# Patient Record
Sex: Female | Born: 1960 | Race: White | Hispanic: No | Marital: Married | Smoking: Never smoker
Health system: Southern US, Community
[De-identification: ages and names within clinical notes are randomized; demographics above are authoritative.]

## PROBLEM LIST (undated history)

## (undated) DIAGNOSIS — M719 Bursopathy, unspecified: Secondary | ICD-10-CM

## (undated) DIAGNOSIS — L900 Lichen sclerosus et atrophicus: Secondary | ICD-10-CM

## (undated) DIAGNOSIS — Z923 Personal history of irradiation: Secondary | ICD-10-CM

## (undated) DIAGNOSIS — T8859XA Other complications of anesthesia, initial encounter: Secondary | ICD-10-CM

## (undated) HISTORY — PX: OTHER SURGICAL HISTORY: SHX169

## (undated) HISTORY — PX: OOPHORECTOMY: SHX86

## (undated) HISTORY — DX: Lichen sclerosus et atrophicus: L90.0

## (undated) HISTORY — PX: BUNIONECTOMY: SHX129

## (undated) HISTORY — DX: Bursopathy, unspecified: M71.9

---

## 1998-10-25 ENCOUNTER — Other Ambulatory Visit: Admission: RE | Admit: 1998-10-25 | Discharge: 1998-10-25 | Payer: Self-pay | Admitting: Gynecology

## 2000-01-08 ENCOUNTER — Encounter: Payer: Self-pay | Admitting: Gynecology

## 2000-01-08 ENCOUNTER — Encounter: Admission: RE | Admit: 2000-01-08 | Discharge: 2000-01-08 | Payer: Self-pay | Admitting: Gynecology

## 2000-01-15 ENCOUNTER — Encounter: Admission: RE | Admit: 2000-01-15 | Discharge: 2000-01-15 | Payer: Self-pay | Admitting: Gynecology

## 2000-01-15 ENCOUNTER — Encounter: Payer: Self-pay | Admitting: Gynecology

## 2000-02-11 ENCOUNTER — Other Ambulatory Visit: Admission: RE | Admit: 2000-02-11 | Discharge: 2000-02-11 | Payer: Self-pay | Admitting: Gynecology

## 2000-02-18 ENCOUNTER — Other Ambulatory Visit: Admission: RE | Admit: 2000-02-18 | Discharge: 2000-02-18 | Payer: Self-pay | Admitting: Gynecology

## 2000-04-11 ENCOUNTER — Ambulatory Visit (HOSPITAL_COMMUNITY): Admission: RE | Admit: 2000-04-11 | Discharge: 2000-04-11 | Payer: Self-pay | Admitting: Obstetrics and Gynecology

## 2000-04-11 ENCOUNTER — Encounter: Payer: Self-pay | Admitting: Obstetrics and Gynecology

## 2000-08-25 HISTORY — PX: ABDOMINAL HYSTERECTOMY: SHX81

## 2001-02-16 ENCOUNTER — Other Ambulatory Visit: Admission: RE | Admit: 2001-02-16 | Discharge: 2001-02-16 | Payer: Self-pay | Admitting: Obstetrics and Gynecology

## 2001-02-16 ENCOUNTER — Encounter: Payer: Self-pay | Admitting: Gynecology

## 2001-02-16 ENCOUNTER — Encounter: Admission: RE | Admit: 2001-02-16 | Discharge: 2001-02-16 | Payer: Self-pay | Admitting: Gynecology

## 2001-05-18 ENCOUNTER — Encounter (INDEPENDENT_AMBULATORY_CARE_PROVIDER_SITE_OTHER): Payer: Self-pay | Admitting: Specialist

## 2001-05-18 ENCOUNTER — Inpatient Hospital Stay (HOSPITAL_COMMUNITY): Admission: RE | Admit: 2001-05-18 | Discharge: 2001-05-21 | Payer: Self-pay | Admitting: Gynecology

## 2002-03-10 ENCOUNTER — Encounter: Payer: Self-pay | Admitting: Gynecology

## 2002-03-10 ENCOUNTER — Encounter: Admission: RE | Admit: 2002-03-10 | Discharge: 2002-03-10 | Payer: Self-pay | Admitting: Gynecology

## 2002-03-10 ENCOUNTER — Other Ambulatory Visit: Admission: RE | Admit: 2002-03-10 | Discharge: 2002-03-10 | Payer: Self-pay | Admitting: Gynecology

## 2003-04-10 ENCOUNTER — Encounter: Payer: Self-pay | Admitting: Obstetrics and Gynecology

## 2003-04-10 ENCOUNTER — Encounter: Admission: RE | Admit: 2003-04-10 | Discharge: 2003-04-10 | Payer: Self-pay | Admitting: Obstetrics and Gynecology

## 2003-04-10 ENCOUNTER — Other Ambulatory Visit: Admission: RE | Admit: 2003-04-10 | Discharge: 2003-04-10 | Payer: Self-pay | Admitting: Gynecology

## 2004-04-17 ENCOUNTER — Other Ambulatory Visit: Admission: RE | Admit: 2004-04-17 | Discharge: 2004-04-17 | Payer: Self-pay | Admitting: Gynecology

## 2004-04-17 ENCOUNTER — Encounter: Admission: RE | Admit: 2004-04-17 | Discharge: 2004-04-17 | Payer: Self-pay | Admitting: Obstetrics and Gynecology

## 2005-04-22 ENCOUNTER — Encounter: Admission: RE | Admit: 2005-04-22 | Discharge: 2005-04-22 | Payer: Self-pay | Admitting: Gynecology

## 2005-04-22 ENCOUNTER — Other Ambulatory Visit: Admission: RE | Admit: 2005-04-22 | Discharge: 2005-04-22 | Payer: Self-pay | Admitting: Gynecology

## 2006-05-05 ENCOUNTER — Other Ambulatory Visit: Admission: RE | Admit: 2006-05-05 | Discharge: 2006-05-05 | Payer: Self-pay | Admitting: Gynecology

## 2006-05-05 ENCOUNTER — Ambulatory Visit (HOSPITAL_COMMUNITY): Admission: RE | Admit: 2006-05-05 | Discharge: 2006-05-05 | Payer: Self-pay | Admitting: Gynecology

## 2007-05-07 ENCOUNTER — Other Ambulatory Visit: Admission: RE | Admit: 2007-05-07 | Discharge: 2007-05-07 | Payer: Self-pay | Admitting: Gynecology

## 2007-05-10 ENCOUNTER — Encounter: Admission: RE | Admit: 2007-05-10 | Discharge: 2007-05-10 | Payer: Self-pay | Admitting: Gynecology

## 2007-10-26 ENCOUNTER — Encounter: Admission: RE | Admit: 2007-10-26 | Discharge: 2007-10-26 | Payer: Self-pay | Admitting: Gynecology

## 2008-05-26 ENCOUNTER — Ambulatory Visit: Payer: Self-pay | Admitting: Women's Health

## 2008-05-26 ENCOUNTER — Other Ambulatory Visit: Admission: RE | Admit: 2008-05-26 | Discharge: 2008-05-26 | Payer: Self-pay | Admitting: Gynecology

## 2008-05-26 ENCOUNTER — Encounter: Payer: Self-pay | Admitting: Women's Health

## 2008-05-26 ENCOUNTER — Encounter: Admission: RE | Admit: 2008-05-26 | Discharge: 2008-05-26 | Payer: Self-pay | Admitting: Gynecology

## 2008-06-02 ENCOUNTER — Encounter: Admission: RE | Admit: 2008-06-02 | Discharge: 2008-06-02 | Payer: Self-pay | Admitting: Gynecology

## 2008-06-20 ENCOUNTER — Ambulatory Visit: Payer: Self-pay | Admitting: Women's Health

## 2009-06-06 ENCOUNTER — Ambulatory Visit: Payer: Self-pay | Admitting: Women's Health

## 2009-06-06 ENCOUNTER — Other Ambulatory Visit: Admission: RE | Admit: 2009-06-06 | Discharge: 2009-06-06 | Payer: Self-pay | Admitting: Gynecology

## 2009-06-06 ENCOUNTER — Encounter: Admission: RE | Admit: 2009-06-06 | Discharge: 2009-06-06 | Payer: Self-pay | Admitting: Gynecology

## 2009-06-06 ENCOUNTER — Encounter: Payer: Self-pay | Admitting: Women's Health

## 2010-06-07 ENCOUNTER — Other Ambulatory Visit: Admission: RE | Admit: 2010-06-07 | Discharge: 2010-06-07 | Payer: Self-pay | Admitting: Gynecology

## 2010-06-07 ENCOUNTER — Ambulatory Visit: Payer: Self-pay | Admitting: Women's Health

## 2010-06-07 ENCOUNTER — Encounter: Admission: RE | Admit: 2010-06-07 | Discharge: 2010-06-07 | Payer: Self-pay | Admitting: Gynecology

## 2010-06-20 ENCOUNTER — Encounter: Admission: RE | Admit: 2010-06-20 | Discharge: 2010-06-20 | Payer: Self-pay | Admitting: Gynecology

## 2011-01-10 NOTE — H&P (Signed)
[facility]  Patient:    Connie Alvarez, Connie Alvarez Visit Number: [id number] MRN: [id number]          Service Type: Attending:  Douglass Rivers, M.D. Dictated by:   Douglass Rivers, M.D.                           History and Physical  CHIEF COMPLAINT:              Suspect right ovarian endometrioma, cervical endometriosis.  HISTORY OF PRESENT ILLNESS:   The patient is a 50 year old G2, P2 contracepting with a vasectomy who was noted on her annual exam this year complaining of increasing dysmenorrhea, which has been increasing over the past several years but it has been getting substantially worse.  She was noted also during her exam to have a reddened area on her cervix that failed to resolve and underwent a colposcopy.  She had an ultrasound done to evaluate her pelvis, which showed persistence of a smoothly marginated complex cyst on the right adnexa that measured 24 x 23 x 20 after two months of follow-up with homogenous fluid.  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  Based on that, the presumption is that she has endometriosis in the pelvis and she is presenting electively for definitive surgery.  OB/GYN HISTORY:               Unremarkable.  She had two vaginal deliveries. Her Pap smears have been normal.  Her menses are every 25-28 days.  Of note, she attempted to be a donor egg for her sister in August 2001 and underwent ovarian stimulation for one cycle.  PAST MEDICAL HISTORY:         Significant only for headaches.  PAST SURGICAL HISTORY:        Significant for foot surgery in 1981 and again in 1999.  MEDICATIONS:                  None.  ALLERGIES:                    No known drug allergies.  PHYSICAL EXAMINATION:  GENERAL:                      She is a well-appearing female in no acute distress.  HEENT:                        Unremarkable.  NECK:                          Thyroid is smooth, nontender, and mobile.  HEART:                        Regular rate.  LUNGS:                        Clear to auscultation.  BREASTS:                      Without mass, discharge, retractions in the supine and upright position.  ABDOMEN:                      Soft and nontender.  GYNECOLOGIC:  Normal external female genitalia.  The BUS is negative.  The vagina is thick and moist and the cervix has three individual blebs beneath the squamocolumnar junction.  Biopsy of which is consistent with endometriosis.  The cervix is otherwise nontender.  The uterus is minimally tender but is mobile.  The adnexa were nontender.  EXTREMITIES:                  Negative.  LABORATORY DATA:              Ultrasound showed the uterus to be anteverted at 9.3 x 4.6 x 4.8 and the right ovarian complex mass of 2.4 x 2.3 x 2.  The left ovary is unremarkable.  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 It is suspected ovarian endometriosis on the right adnexa with increasing dysmenorrhea felt to be probably secondary to pelvic endometriosis.  The patient will present electively for definitive surgery.  She will have a TAH/RSO with fulguration of any endometriosis seen in the pelvis.  We had discussed with the patient and her husband the possibility of doing an LSO, in addition, if there is extensive disease or trying to retain the ovary and suppressing her postoperatively with Lupron.  They will discuss it and let us know prior to surgery.  Of course, if there is any extensive disease on that ovary, then we have permission to remove it at the time of surgery.  All questions were addressed.  Risks of bleeding, infection, damage to underlying bowel, bladder, ureters were discussed.  She will be bowel prepped because of the risk of damage to her colon.  Again, she will present in the morning of September 24. Dictated by:   Douglass Rivers, M.D. Attending:  Douglass Rivers, M.D. DD:  05/17/01 TD:  05/17/01 Job: [id number]

## 2011-01-10 NOTE — Op Note (Signed)
[facility]  Patient:    Connie Alvarez, Connie Alvarez Visit Number: [id number] MRN: [id number]          Service Type: GYN Location: 9300 [id number] Attending Physician:  Douglass Rivers Dictated by:   Douglass Rivers, M.D. Proc. Date: 05/18/01 Admit Date:  05/18/2001                             Operative Report  PREOPERATIVE DIAGNOSES:       1. Increasing dysmenorrhea.                               2. Cervical endometriosis.                               3. Suspect right endometrioma.  POSTOPERATIVE DIAGNOSES:      1. Increasing dysmenorrhea.                               2. Cervical endometriosis.                               3. Suspect right endometrioma.                               4. Extensive peritoneal endometriosis.  PROCEDURE:                    Total abdominal hysterectomy with bilateral salpingo-oophorectomy and fulguration of endometriosis.  SURGEON:                      Douglass Rivers, M.D.  ASSISTANT:                    Nadyne Coombes. Fontaine, M.D.  ANESTHESIA:                   Epidural.  IV FLUIDS:                    2 L lactated Ringers.  URINE OUTPUT:                 425 cc clear.  ESTIMATED BLOOD LOSS:         100 cc.  FINDINGS:                     There was a right ovarian endometrioma.  The left ovary had serosal disease.  It was markedly adherent to the side wall as well as colonic adhesions to the left adnexa.  There was scattered endometriosis throughout the bowel serosa, peritoneum and bladder.  The uterus was grossly normal.  PATHOLOGY:                    Uterus, cervix, tubes and ovaries.  COMPLICATIONS:                None.  DESCRIPTION OF PROCEDURE:     The patient was taken to the operating room. Epidural anesthesia was induced.  The patient was placed in the supine position, prepped and draped in the usual sterile fashion.  After adequate anesthesia was ensured, a Pfannenstiel skin incision  was made with a scalpel and  carried through to the underlying layer of fascia with electrocautery. The fascia was  scored in the midline.  The incision was extended laterally with Mayo scissors.  The superior aspect of the fascial incision was grasped with Kochers.  The underlying rectus muscles were dissected off with blunt and sharp dissection.  In a similar fashion, the superior aspect of the incision was grasped with Kochers.  The underlying rectus muscles were dissected off. The rectus muscles were separated in the midline.  The peritoneum was identified and entered bluntly.  The peritoneal incision was then extended superiorly and inferiorly with good visualization of the bowel and bladder. An OConner-OSullivan retractor was then placed in the incision with careful attention lateral to the psoas muscles.  The bowel was packed way with moist lap packs x 3 and upper and lower blades were placed.  There were noted to be extensive colonic adhesions to the left adnexa.  Scattered endometriosis was seen on the bladder flap anteriorly.  The adnexa were grasped with Heaney clamps.  The right round ligament was transected and suture ligated with 0 Vicryl.  The anterior leaf of the broad ligament was incised.  The posterior leaf was also incised.  The infundibulopelvic vessels were skeletonized.  The posterior leaf was entered sharply and transected.  A free tie was then placed and then a suture ligature, again with 0 Vicryl.  Attention was then turned towards the left.  The round ligament again was grasped and transected with electrocautery after the bowel was sharply dissected off.  In a similar fashion, the anterior leaf of the broad ligament was incised with careful attention to the endometriosis that was scattered around the peritoneum in order to include that in our dissection.  The tubo-ovarian ligament was then cross clamped, transected, free tied and a suture ligature of 0 Vicryl was placed.  The uterine vessels  on the left were skeletonized and the anterior leaf of the broad ligament.  Dissection was carried through to the midline bilaterally.  The bladder was noted to be markedly adherent to the uterus and cervix and required sharp dissection.  The uterine vessels were then successfully skeletonized with careful attention to the bladder anteriorly. They were transected and suture ligated x 2 with 0 Vicryl.  This was ultimately done bilaterally.  Again, attention was turned to the bladder and sharp dissection down was continued.  The cardinal ligaments were then transected and suture ligated bilaterally.  The uterosacral ligament was then taken in separate bites and was noted to be markedly thickened and scarred. Again, this was transected and suture ligated with 0 Vicryl.  The vagina was entered sharply during a lateral bite on the left and the cervix and uterus were sharply dissected off with a Jorgenson scissors.  The vaginal cuff was held with Kochers.  The angles were everted and transfixed to the ipsilateral cardinal and uterosacral ligaments.  Again, this was done bilaterally.  The vagina was then closed in a baseball-like fashion circumferentially, with careful attention to the bladder anteriorly with 0 Vicryl in a running locking fashion.  The pelvis was then irrigated with copious amounts of warm saline. Inspection of the bladder and peritoneum ensued.  Although the cul-de-sac was free, there was noted to be extensive scattered endometriosis on the serosa. When inspecting the pedicles on the left, we noted a large amount of endometriosis on the left ovary.  Although having a hemorrhagic cyst, it was otherwise unremarkable, but  was noted to be markedly adherent to the side wall.  It was bluntly dissected of and an extensive amount of endometriosis was seen underneath the ovary.  The bowel needed to be further sharply dissected off this adnexa.  The ureter was identified.  The  retroperitoneal space was extended.  Peristalsis was seen.  The infundibulopelvic vessels were also visualized, cross clamped and again free ties and suture ligatures of 0  Vicryl were placed.  Inspection of this area of the peritoneum showed that we had dissected most of it off with removal of the left tube and ovary.  The adnexa was noted to be hemostatic.  The endometriosis that was scattered on the colon was markedly adherent, and we felt that, now that we had removed both ovaries, that we would leave that in place and allow it to burn out with surgical menopause.  The pelvis was irrigated with copious amounts of warm saline.  Hemostasis was ensured at all pedicles.  Small areas of bleeding on the serosa and peritoneum were treated with electrocautery and noted to be hemostatic.  Sponges and retractors were then removed from the incision. Inspection of the peritoneum and muscles again assured [place] of hemostasis.  The fascia was closed with 0 Vicryl in a running fashion.  This was done bilaterally.  The subcu was irrigated.  The skin was closed with staples.  The patient tolerated the procedure well.  Sponge, lap, and needle counts were correct x 2.  She was transferred to the PACU in stable condition.  She also received cefotetan intraoperatively. Dictated by:   Douglass Rivers, M.D. Attending Physician:  Douglass Rivers DD:  05/18/01 TD:  05/18/01 Job: [id number]

## 2011-01-10 NOTE — Discharge Summary (Signed)
[facility]  Patient:    Connie Alvarez, Connie Alvarez Visit Number: [id number] MRN: [id number]          Service Type: GYN Location: 9300 [id number] [id number] Attending Physician:  Douglass Rivers Dictated by:   Antony Contras, [facility] Admit Date:  05/18/2001 Discharge Date: 05/21/2001                             Discharge Summary  DISCHARGE DIAGNOSES:          1. Increasing dysmenorrhea.                               2. Cervical endometriosis.                               3. Suspect right endometrioma.                               4. Extensive peritoneal endometriosis.  PROCEDURES:                   1. Total abdominal hysterectomy.                               2. Bilateral salpingo-oophorectomy.                               3. Fulguration of endometriosis.  HISTORY OF PRESENT ILLNESS:   The patient is a 50 year old, gravida 2, para 2, contraception with a vasectomy. She has been complaining of increasing dysmenorrhea over the past several years which has been substantially worse. She was noted during her exam to have a reddened area on her cervix that failed to resolve and underwent a colposcopy. Ultrasound showed a persistence of a smooth and marginated complex cyst on the right adnexa. 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 The patient is presenting for definitive surgery due to the presumption that she has endometriosis of the pelvis.  PAST MEDICAL HISTORY:         Past medical history includes a history of headaches. She has had two vaginal deliveries, normal Pap smears, and menses q.25-28 days.  ALLERGIES:                    No known drug allergies.  HOSPITAL COURSE/TREATMENT:    The patient was admitted on May 18, 2001. A total abdominal hysterectomy and bilateral salpingo-oophorectomy and fulguration of endometriosis was performed by Dr. Farrel Gobble assisted by Dr.  Audie Box under epidural anesthesia. Findings included a right ovarian endometrioma. The left ovary had serosal disease, was markedly adherent to the sidewall as well as colonic adhesions to the left adnexa. There was scattered endometriosis to the bowel serosa, peritoneum, and bladder. The uterus was grossly normal.  Postoperatively, the patient remained afebrile, had no difficulty voiding, and was able to be discharged on her third postoperative day in satisfactory condition.  Postoperative CBC:  Hematocrit 30.9, hemoglobin 10.8, WBCs 8.1, and platelets 198,000.  DISPOSITION:                  The patient was to  follow up in two weeks in the office with Dr. Farrel Gobble. Dictated by:   Antony Contras, [facility] Attending Physician:  Douglass Rivers DD:  06/11/01 TD:  06/15/01 Job: [id number]

## 2011-05-22 ENCOUNTER — Other Ambulatory Visit: Payer: Self-pay | Admitting: Gynecology

## 2011-05-22 DIAGNOSIS — Z1231 Encounter for screening mammogram for malignant neoplasm of breast: Secondary | ICD-10-CM

## 2011-06-24 DIAGNOSIS — N809 Endometriosis, unspecified: Secondary | ICD-10-CM | POA: Insufficient documentation

## 2011-06-24 DIAGNOSIS — L900 Lichen sclerosus et atrophicus: Secondary | ICD-10-CM | POA: Insufficient documentation

## 2011-06-24 DIAGNOSIS — K635 Polyp of colon: Secondary | ICD-10-CM | POA: Insufficient documentation

## 2011-06-27 ENCOUNTER — Ambulatory Visit
Admission: RE | Admit: 2011-06-27 | Discharge: 2011-06-27 | Disposition: A | Discharge status: Home / Self Care | Payer: BC Managed Care – PPO | Source: Ambulatory Visit | Attending: Gynecology | Admitting: Gynecology

## 2011-06-27 ENCOUNTER — Encounter: Payer: Self-pay | Admitting: Women's Health

## 2011-06-27 ENCOUNTER — Other Ambulatory Visit (HOSPITAL_COMMUNITY)
Admission: RE | Admit: 2011-06-27 | Discharge: 2011-06-27 | Disposition: A | Discharge status: Home / Self Care | Payer: BC Managed Care – PPO | Source: Ambulatory Visit | Attending: Women's Health | Admitting: Women's Health

## 2011-06-27 ENCOUNTER — Ambulatory Visit (INDEPENDENT_AMBULATORY_CARE_PROVIDER_SITE_OTHER): Payer: BC Managed Care – PPO | Admitting: Women's Health

## 2011-06-27 VITALS — BP 110/60 | Ht 65.0 in | Wt 152.0 lb

## 2011-06-27 DIAGNOSIS — Z01419 Encounter for gynecological examination (general) (routine) without abnormal findings: Secondary | ICD-10-CM | POA: Insufficient documentation

## 2011-06-27 DIAGNOSIS — Z1231 Encounter for screening mammogram for malignant neoplasm of breast: Secondary | ICD-10-CM

## 2011-06-27 DIAGNOSIS — Z833 Family history of diabetes mellitus: Secondary | ICD-10-CM

## 2011-06-27 DIAGNOSIS — Z780 Asymptomatic menopausal state: Secondary | ICD-10-CM

## 2011-06-27 MED ORDER — CLOBETASOL PROPIONATE 0.05 % EX CREA: TOPICAL_CREAM | Freq: Two times a day (BID) | CUTANEOUS | Status: AC

## 2011-06-27 MED ORDER — ESTRADIOL 1 MG PO TABS: 1.0000 mg | ORAL_TABLET | Freq: Every day | ORAL | Status: DC

## 2011-06-27 NOTE — Progress Notes (Signed)
Connie Alvarez 05-Nov-1960 [id number]    History:    The patient presents for annual exam.  Daughter [name], attorney working in [place] doing well. Other daughter living in [place] as a Risk analyst doing well. Works in Higher education careers adviser.   Past medical history, past surgical history, family history and social history were all reviewed and documented in the EPIC chart.   ROS:  A  ROS was performed and pertinent positives and negatives are included in the history.  Exam:  Filed Vitals:   06/27/11 0937  BP: 110/60    General appearance:  Normal Head/Neck:  Normal, without cervical or supraclavicular adenopathy. Thyroid:  Symmetrical, normal in size, without palpable masses or nodularity. Respiratory  Effort:  Normal  Auscultation:  Clear without wheezing or rhonchi Cardiovascular  Auscultation:  Regular rate, without rubs, murmurs or gallops  Edema/varicosities:  Not grossly evident Abdominal  Soft,nontender, without masses, guarding or rebound.  Liver/spleen:  No organomegaly noted  Hernia:  None appreciated  Skin  Inspection:  Grossly normal  Palpation:  Grossly normal Neurologic/psychiatric  Orientation:  Normal with appropriate conversation.  Mood/affect:  Normal  Genitourinary    Breasts: Examined lying and sitting.     Right: Without masses, retractions, discharge or axillary adenopathy.     Left: Without masses, retractions, discharge or axillary adenopathy.   Inguinal/mons:  Normal without inguinal adenopathy  External genitalia:  Normal  BUS/Urethra/Skene's glands:  Normal  Bladder:  Normal  Vagina:  Normal  Cervix:   absent  Uterus:    Adnexa/parametria:     Rt: Without masses or tenderness.   Lt: Without masses or tenderness.  Anus and perineum: Normal  Digital rectal exam: Normal sphincter tone without palpated masses or tenderness  Assessment/Plan:  50 y.o. MWF G2P2 for annual exam with no complaints. Colonoscopy in 2011  with 1 negative polyp, is aware to have repeat in 4 years. Normal DEXA in 2009 hip +0.4,  Has had normal mammograms.  TAH with BSO in 02 for endometriosis on ERT Lichen sclerosus/ clitoral hood  Plan: Estradiol 1 mg by mouth daily, slight risk for blood clots strokes and breast cancer was reviewed. States feels well would like to continue. SBEs, annual mammogram, continue exercise, healthy lifestyle, calcium rich diet, vitamin D 1000 daily. Temovate twice a day when necessary, is aware to use sparingly. Denies any problems at this time. CBC, glucose UA and Pap. Had a normal lipid profile last year. DEXA, will schedule.   Harrington Challenger [facility], 10:44 AM 06/27/2011

## 2012-07-14 ENCOUNTER — Other Ambulatory Visit: Payer: Self-pay | Admitting: Gynecology

## 2012-07-14 DIAGNOSIS — Z1231 Encounter for screening mammogram for malignant neoplasm of breast: Secondary | ICD-10-CM

## 2012-07-28 ENCOUNTER — Ambulatory Visit (INDEPENDENT_AMBULATORY_CARE_PROVIDER_SITE_OTHER): Payer: BC Managed Care – PPO | Admitting: Women's Health

## 2012-07-28 ENCOUNTER — Encounter: Payer: Self-pay | Admitting: Women's Health

## 2012-07-28 VITALS — BP 110/70 | Ht 65.5 in | Wt 147.0 lb

## 2012-07-28 DIAGNOSIS — K635 Polyp of colon: Secondary | ICD-10-CM

## 2012-07-28 DIAGNOSIS — Z833 Family history of diabetes mellitus: Secondary | ICD-10-CM

## 2012-07-28 DIAGNOSIS — Z01419 Encounter for gynecological examination (general) (routine) without abnormal findings: Secondary | ICD-10-CM

## 2012-07-28 DIAGNOSIS — Z780 Asymptomatic menopausal state: Secondary | ICD-10-CM

## 2012-07-28 DIAGNOSIS — D126 Benign neoplasm of colon, unspecified: Secondary | ICD-10-CM

## 2012-07-28 DIAGNOSIS — Z13220 Encounter for screening for lipoid disorders: Secondary | ICD-10-CM

## 2012-07-28 LAB — CBC WITH DIFFERENTIAL/PLATELET
Basophils Absolute: 0 10*3/uL (ref 0.0–0.1)
Eosinophils Absolute: 0.1 10*3/uL (ref 0.0–0.7)
Eosinophils Relative: 3 % (ref 0–5)
Lymphocytes Relative: 32 % (ref 12–46)
Lymphs Abs: 1.2 10*3/uL (ref 0.7–4.0)
MCH: 29 pg (ref 26.0–34.0)
MCV: 89.5 fL (ref 78.0–100.0)
Neutrophils Relative %: 53 % (ref 43–77)
Platelets: 252 10*3/uL (ref 150–400)
RBC: 4.27 MIL/uL (ref 3.87–5.11)
RDW: 13.4 % (ref 11.5–15.5)
WBC: 3.8 10*3/uL — ABNORMAL LOW (ref 4.0–10.5)

## 2012-07-28 LAB — LIPID PANEL: LDL Cholesterol: 144 mg/dL — ABNORMAL HIGH (ref 0–99)

## 2012-07-28 LAB — GLUCOSE, RANDOM: Glucose, Bld: 82 mg/dL (ref 70–99)

## 2012-07-28 MED ORDER — ESTRADIOL 1 MG PO TABS: 0.5000 mg | ORAL_TABLET | Freq: Every day | ORAL | Status: DC

## 2012-07-28 MED ORDER — CLOBETASOL PROPIONATE 0.05 % EX CREA: TOPICAL_CREAM | Freq: Two times a day (BID) | CUTANEOUS | Status: DC

## 2012-07-28 NOTE — Progress Notes (Signed)
Connie Alvarez 53-Sep-1962 [id number]    History:    The patient presents for annual exam.  TAH with BSO for endometriosis in 2002 on estradiol. Colonoscopy 2011 with 1 benign polyp. Bone density 05/2008 normal T score, bilateral hip average 0.3. History of normal Paps and mammograms. 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   Past medical history, past surgical history, family history and social history were all reviewed and documented in the EPIC chart. Currently not working outside the home. 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 Mother with  type 2 diabetes and hypertension, both parents living/late 62s.   ROS:  A  ROS was performed and pertinent positives and negatives are included in the history.  Exam:  Filed Vitals:   07/28/12 1053  BP: 110/70    General appearance:  Normal Head/Neck:  Normal, without cervical or supraclavicular adenopathy. Thyroid:  Symmetrical, normal in size, without palpable masses or nodularity. Respiratory  Effort:  Normal  Auscultation:  Clear without wheezing or rhonchi Cardiovascular  Auscultation:  Regular rate, without rubs, murmurs or gallops  Edema/varicosities:  Not grossly evident Abdominal  Soft,nontender, without masses, guarding or rebound.  Liver/spleen:  No organomegaly noted  Hernia:  None appreciated  Skin  Inspection:  Grossly normal  Palpation:  Grossly normal Neurologic/psychiatric  Orientation:  Normal with appropriate conversation.  Mood/affect:  Normal  Genitourinary    Breasts: Examined lying and sitting.     Right: Without masses, retractions, discharge or axillary adenopathy.     Left: Without masses, retractions, discharge or axillary adenopathy.   Inguinal/mons:  Normal without inguinal adenopathy  External genitalia:  Clitoral hood and posterior fourchette small patch of lichen sclerosis   BUS/Urethra/Skene's glands:  Normal  Bladder:  Normal  Vagina:   Normal  Cervix: Absent  Uterus: Absent  Adnexa/parametria:     Rt: Without masses or tenderness.   Lt: Without masses or tenderness.  Anus and perineum: Normal  Digital rectal exam: Normal sphincter tone without palpated masses or tenderness  Assessment/Plan:  51 y.o. M. WF G2 P2 for for annual exam with no complaints.  TAH/BSO endometriosis on ERT Lichen sclerosis Normal DEXA 05/2008 Benign colon polyp 2011  Plan: Currently on estradiol 1 mg with no hot flushes, will try estradiol 0.5. Prescription, proper use, slight risk for blood clots, strokes, breast cancer reviewed. Instructed to call if problems with change. SBE's, continue annual mammogram, scheduled in January. DEXA, will schedule next year, reviewed every five-years if normal. Reviewed importance to continue regular exercise, calcium rich diet, vitamin D 2000 daily. CBC, glucose, lipid panel, UA, home Hemoccult card given, Pap normal 2012, new screening guidelines reviewed. Temovate prescription, proper use, use twice daily as needed. Continue vaginal lubricants with intercourse.   Connie Alvarez [facility], 11:38 AM 07/28/2012

## 2012-07-28 NOTE — Progress Notes (Deleted)
Connie Alvarez 18-May-1961 [id number]    History:    The patient presents for annual exam.  ***   Past medical history, past surgical history, family history and social history were all reviewed and documented in the EPIC chart.   ROS:  A  ROS was performed and pertinent positives and negatives are included in the history.  Exam:  Filed Vitals:   07/28/12 1053  BP: 110/70    General appearance:  Normal Head/Neck:  Normal, without cervical or supraclavicular adenopathy. Thyroid:  Symmetrical, normal in size, without palpable masses or nodularity. Respiratory  Effort:  Normal  Auscultation:  Clear without wheezing or rhonchi Cardiovascular  Auscultation:  Regular rate, without rubs, murmurs or gallops  Edema/varicosities:  Not grossly evident Abdominal  Soft,nontender, without masses, guarding or rebound.  Liver/spleen:  No organomegaly noted  Hernia:  None appreciated  Skin  Inspection:  Grossly normal  Palpation:  Grossly normal Neurologic/psychiatric  Orientation:  Normal with appropriate conversation.  Mood/affect:  Normal  Genitourinary    Breasts: Examined lying and sitting.     Right: Without masses, retractions, discharge or axillary adenopathy.     Left: Without masses, retractions, discharge or axillary adenopathy.   Inguinal/mons:  Normal without inguinal adenopathy  External genitalia:  Normal  BUS/Urethra/Skene's glands:  Normal  Bladder:  Normal  Vagina:  Normal  Cervix:  Normal  Uterus:  ***verted, normal in size, shape and contour.  Midline and mobile  Adnexa/parametria:     Rt: Without masses or tenderness.   Lt: Without masses or tenderness.  Anus and perineum: Normal  Digital rectal exam: Normal sphincter tone without palpated masses or tenderness  Assessment/Plan:  51 y.o.  for annual exam.   ***.    Harrington Challenger [facility], 11:41 AM 07/28/2012

## 2012-07-28 NOTE — Patient Instructions (Signed)

## 2012-07-29 LAB — URINALYSIS W MICROSCOPIC + REFLEX CULTURE
Bilirubin Urine: NEGATIVE
Casts: NONE SEEN
Crystals: NONE SEEN
Glucose, UA: NEGATIVE mg/dL
Ketones, ur: NEGATIVE mg/dL
Nitrite: NEGATIVE
Specific Gravity, Urine: 1.016 (ref 1.005–1.030)
pH: 6 (ref 5.0–8.0)

## 2012-08-02 ENCOUNTER — Other Ambulatory Visit: Payer: Self-pay | Admitting: Women's Health

## 2012-08-02 DIAGNOSIS — E785 Hyperlipidemia, unspecified: Secondary | ICD-10-CM

## 2012-08-05 ENCOUNTER — Other Ambulatory Visit: Payer: Self-pay | Admitting: Women's Health

## 2012-08-05 DIAGNOSIS — N390 Urinary tract infection, site not specified: Secondary | ICD-10-CM

## 2012-08-06 ENCOUNTER — Encounter: Payer: Self-pay | Admitting: Gynecology

## 2012-08-10 ENCOUNTER — Other Ambulatory Visit: Payer: BC Managed Care – PPO

## 2012-08-10 DIAGNOSIS — N390 Urinary tract infection, site not specified: Secondary | ICD-10-CM

## 2012-08-11 LAB — URINALYSIS W MICROSCOPIC + REFLEX CULTURE
Bacteria, UA: NONE SEEN
Hgb urine dipstick: NEGATIVE
Ketones, ur: NEGATIVE mg/dL
Nitrite: NEGATIVE
Protein, ur: NEGATIVE mg/dL
Urobilinogen, UA: 0.2 mg/dL (ref 0.0–1.0)

## 2012-08-13 LAB — URINE CULTURE

## 2012-09-02 ENCOUNTER — Ambulatory Visit
Admission: RE | Admit: 2012-09-02 | Discharge: 2012-09-02 | Disposition: A | Discharge status: Home / Self Care | Payer: BC Managed Care – PPO | Source: Ambulatory Visit | Attending: Gynecology | Admitting: Gynecology

## 2012-09-02 DIAGNOSIS — Z1231 Encounter for screening mammogram for malignant neoplasm of breast: Secondary | ICD-10-CM

## 2012-10-09 ENCOUNTER — Other Ambulatory Visit: Payer: Self-pay

## 2013-06-30 ENCOUNTER — Other Ambulatory Visit: Payer: Self-pay

## 2013-09-29 ENCOUNTER — Other Ambulatory Visit: Payer: Self-pay

## 2013-09-29 DIAGNOSIS — Z1231 Encounter for screening mammogram for malignant neoplasm of breast: Secondary | ICD-10-CM

## 2013-10-13 ENCOUNTER — Ambulatory Visit: Payer: BC Managed Care – PPO

## 2013-10-25 ENCOUNTER — Ambulatory Visit (INDEPENDENT_AMBULATORY_CARE_PROVIDER_SITE_OTHER): Payer: No Typology Code available for payment source | Admitting: Women's Health

## 2013-10-25 ENCOUNTER — Ambulatory Visit
Admission: RE | Admit: 2013-10-25 | Discharge: 2013-10-25 | Disposition: A | Discharge status: Home / Self Care | Payer: No Typology Code available for payment source | Source: Ambulatory Visit

## 2013-10-25 ENCOUNTER — Encounter: Payer: Self-pay | Admitting: Women's Health

## 2013-10-25 VITALS — BP 112/62 | Ht 65.5 in | Wt 155.2 lb

## 2013-10-25 DIAGNOSIS — Z780 Asymptomatic menopausal state: Secondary | ICD-10-CM

## 2013-10-25 DIAGNOSIS — Z01419 Encounter for gynecological examination (general) (routine) without abnormal findings: Secondary | ICD-10-CM

## 2013-10-25 DIAGNOSIS — Z1231 Encounter for screening mammogram for malignant neoplasm of breast: Secondary | ICD-10-CM

## 2013-10-25 DIAGNOSIS — Z833 Family history of diabetes mellitus: Secondary | ICD-10-CM

## 2013-10-25 LAB — CBC WITH DIFFERENTIAL/PLATELET
BASOS PCT: 1 % (ref 0–1)
Basophils Absolute: 0 10*3/uL (ref 0.0–0.1)
EOS PCT: 4 % (ref 0–5)
Eosinophils Absolute: 0.2 10*3/uL (ref 0.0–0.7)
HEMATOCRIT: 38.2 % (ref 36.0–46.0)
HEMOGLOBIN: 12.6 g/dL (ref 12.0–15.0)
Lymphocytes Relative: 33 % (ref 12–46)
Lymphs Abs: 1.5 10*3/uL (ref 0.7–4.0)
MCH: 27.8 pg (ref 26.0–34.0)
MCHC: 33 g/dL (ref 30.0–36.0)
MCV: 84.1 fL (ref 78.0–100.0)
MONO ABS: 0.4 10*3/uL (ref 0.1–1.0)
MONOS PCT: 10 % (ref 3–12)
NEUTROS ABS: 2.3 10*3/uL (ref 1.7–7.7)
Neutrophils Relative %: 52 % (ref 43–77)
Platelets: 284 10*3/uL (ref 150–400)
RBC: 4.54 MIL/uL (ref 3.87–5.11)
RDW: 14 % (ref 11.5–15.5)
WBC: 4.4 10*3/uL (ref 4.0–10.5)

## 2013-10-25 LAB — GLUCOSE, RANDOM: GLUCOSE: 94 mg/dL (ref 70–99)

## 2013-10-25 MED ORDER — CLOBETASOL PROPIONATE 0.05 % EX CREA: TOPICAL_CREAM | Freq: Two times a day (BID) | CUTANEOUS | Status: DC

## 2013-10-25 MED ORDER — ESTRADIOL 0.5 MG PO TABS: 0.5000 mg | ORAL_TABLET | Freq: Every day | ORAL | Status: DC

## 2013-10-25 NOTE — Progress Notes (Signed)
Connie Alvarez June 04, 1961 [id number]    History:    Presents for annual exam.  TAH with BSO 2002 for endometriosis on estradiol 0.5 but stopped September 2014 and has had numerous hot flushes since. 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 Benign colon polyp 2011. Normal bone density 2009. Normal Pap and mammogram history.  Past medical history, past surgical history, family history and social history were all reviewed and documented in the EPIC chart. Laid off last year. Contemplating going back to work. Mickel Baas lives in [place] job, Caryl Pina an Forensic psychologist in [place].  ROS:  A  ROS was performed and pertinent positives and negatives are included.  Exam:  Filed Vitals:   10/25/13 0853  BP: 112/62    General appearance:  Normal Thyroid:  Symmetrical, normal in size, without palpable masses or nodularity. Respiratory  Auscultation:  Clear without wheezing or rhonchi Cardiovascular  Auscultation:  Regular rate, without rubs, murmurs or gallops  Edema/varicosities:  Not grossly evident Abdominal  Soft,nontender, without masses, guarding or rebound.  Liver/spleen:  No organomegaly noted  Hernia:  None appreciated  Skin  Inspection:  Grossly normal   Breasts: Examined lying and sitting.     Right: Without masses, retractions, discharge or axillary adenopathy.     Left: Without masses, retractions, discharge or axillary adenopathy. Gentitourinary   Inguinal/mons:  Normal without inguinal adenopathy  External genitalia: Lichen sclerosis at posterior introitus and clitoral hood  BUS/Urethra/Skene's glands:  Normal  Vagina:  Normal  Cervix:  Absent  Uterus: Absent Adnexa/parametria:     Rt: Without masses or tenderness.   Lt: Without masses or tenderness.  Anus and perineum: Normal  Digital rectal exam: Normal sphincter tone without palpated masses or tenderness  Assessment/Plan:  53 y.o.MWF G2P2  for annual exam.    TAH with BSO/endometriosis-Menopausal symptoms off  HRT Benign colon polyp 7867 Lichen sclerosus  Plan: Temovate ointment twice daily as needed to clitoral hood and posterior fourchette. Instructed to call if no relief of symptoms. SBE's, continue annual mammogram and 3-D tomography history of dense breast. Continue regular exercise, calcium rich diet, vitamin D 2000 daily encouraged. Discussion on HRT, estradiol 0.5 mg daily and then will decreased to half tablet daily. Reviewed need to decrease more slowly, stop in the winter months. Instructed to call if no relief of hot flushes and poor sleep. Reviewed risks of blood clots, strokes, breast cancer. Repeat DEXA. Repeat colonoscopy next year. CBC, glucose, UA,     Huel Cote [facility], 9:36 AM 10/25/2013

## 2013-10-25 NOTE — Patient Instructions (Signed)
Health Recommendations for Postmenopausal Women Respected and ongoing research has looked at the most common causes of death, disability, and poor quality of life in postmenopausal women. The causes include heart disease, diseases of blood vessels, diabetes, depression, cancer, and bone loss (osteoporosis). Many things can be done to help lower the chances of developing these and other common problems: CARDIOVASCULAR DISEASE Heart Disease: A heart attack is a medical emergency. Know the signs and symptoms of a heart attack. Below are things women can do to reduce their risk for heart disease.   Do not smoke. If you smoke, quit.  Aim for a healthy weight. Being overweight causes many preventable deaths. Eat a healthy and balanced diet and drink an adequate amount of liquids.  Get moving. Make a commitment to be more physically active. Aim for 30 minutes of activity on most, if not all days of the week.  Eat for heart health. Choose a diet that is low in saturated fat and cholesterol and eliminate trans fat. Include whole grains, vegetables, and fruits. Read and understand the labels on food containers before buying.  Know your numbers. Ask your caregiver to check your blood pressure, cholesterol (total, HDL, LDL, triglycerides) and blood glucose. Work with your caregiver on improving your entire clinical picture.  High blood pressure. Limit or stop your table salt intake (try salt substitute and food seasonings). Avoid salty foods and drinks. Read labels on food containers before buying. Eating well and exercising can help control high blood pressure. STROKE  Stroke is a medical emergency. Stroke may be the result of a blood clot in a blood vessel in the brain or by a brain hemorrhage (bleeding). Know the signs and symptoms of a stroke. To lower the risk of developing a stroke:  Avoid fatty foods.  Quit smoking.  Control your diabetes, blood pressure, and irregular heart rate. THROMBOPHLEBITIS  (BLOOD CLOT) OF THE LEG  Becoming overweight and leading a stationary lifestyle may also contribute to developing blood clots. Controlling your diet and exercising will help lower the risk of developing blood clots. CANCER SCREENING  Breast Cancer: Take steps to reduce your risk of breast cancer.  You should practice "breast self-awareness." This means understanding the normal appearance and feel of your breasts and should include breast self-examination. Any changes detected, no matter how small, should be reported to your caregiver.  After age 40, you should have a clinical breast exam (CBE) every year.  Starting at age 40, you should consider having a mammogram (breast X-ray) every year.  If you have a family history of breast cancer, talk to your caregiver about genetic screening.  If you are at high risk for breast cancer, talk to your caregiver about having an MRI and a mammogram every year.  Intestinal or Stomach Cancer: Tests to consider are a rectal exam, fecal occult blood, sigmoidoscopy, and colonoscopy. Women who are high risk may need to be screened at an earlier age and more often.  Cervical Cancer:  Beginning at age 30, you should have a Pap test every 3 years as long as the past 3 Pap tests have been normal.  If you have had past treatment for cervical cancer or a condition that could lead to cancer, you need Pap tests and screening for cancer for at least 20 years after your treatment.  If you had a hysterectomy for a problem that was not cancer or a condition that could lead to cancer, then you no longer need Pap tests.    If you are between ages 65 and 70, and you have had normal Pap tests going back 10 years, you no longer need Pap tests.  If Pap tests have been discontinued, risk factors (such as a new sexual partner) need to be reassessed to determine if screening should be resumed.  Some medical problems can increase the chance of getting cervical cancer. In these  cases, your caregiver may recommend more frequent screening and Pap tests.  Uterine Cancer: If you have vaginal bleeding after reaching menopause, you should notify your caregiver.  Ovarian cancer: Other than yearly pelvic exams, there are no reliable tests available to screen for ovarian cancer at this time except for yearly pelvic exams.  Lung Cancer: Yearly chest X-rays can detect lung cancer and should be done on high risk women, such as cigarette smokers and women with chronic lung disease (emphysema).  Skin Cancer: A complete body skin exam should be done at your yearly examination. Avoid overexposure to the sun and ultraviolet light lamps. Use a strong sun block cream when in the sun. All of these things are important in lowering the risk of skin cancer. MENOPAUSE Menopause Symptoms: Hormone therapy products are effective for treating symptoms associated with menopause:  Moderate to severe hot flashes.  Night sweats.  Mood swings.  Headaches.  Tiredness.  Loss of sex drive.  Insomnia.  Other symptoms. Hormone replacement carries certain risks, especially in older women. Women who use or are thinking about using estrogen or estrogen with progestin treatments should discuss that with their caregiver. Your caregiver will help you understand the benefits and risks. The ideal dose of hormone replacement therapy is not known. The Food and Drug Administration (FDA) has concluded that hormone therapy should be used only at the lowest doses and for the shortest amount of time to reach treatment goals.  OSTEOPOROSIS Protecting Against Bone Loss and Preventing Fracture: If you use hormone therapy for prevention of bone loss (osteoporosis), the risks for bone loss must outweigh the risk of the therapy. Ask your caregiver about other medications known to be safe and effective for preventing bone loss and fractures. To guard against bone loss or fractures, the following is recommended:  If  you are less than age 50, take 1000 mg of calcium and at least 600 mg of Vitamin D per day.  If you are greater than age 50 but less than age 70, take 1200 mg of calcium and at least 600 mg of Vitamin D per day.  If you are greater than age 70, take 1200 mg of calcium and at least 800 mg of Vitamin D per day. Smoking and excessive alcohol intake increases the risk of osteoporosis. Eat foods rich in calcium and vitamin D and do weight bearing exercises several times a week as your caregiver suggests. DIABETES Diabetes Melitus: If you have Type I or Type 2 diabetes, you should keep your blood sugar under control with diet, exercise and recommended medication. Avoid too many sweets, starchy and fatty foods. Being overweight can make control more difficult. COGNITION AND MEMORY Cognition and Memory: Menopausal hormone therapy is not recommended for the prevention of cognitive disorders such as Alzheimer's disease or memory loss.  DEPRESSION  Depression may occur at any age, but is common in elderly women. The reasons may be because of physical, medical, social (loneliness), or financial problems and needs. If you are experiencing depression because of medical problems and control of symptoms, talk to your caregiver about this. Physical activity and   exercise may help with mood and sleep. Community and volunteer involvement may help your sense of value and worth. If you have depression and you feel that the problem is getting worse or becoming severe, talk to your caregiver about treatment options that are best for you. ACCIDENTS  Accidents are common and can be serious in the elderly woman. Prepare your house to prevent accidents. Eliminate throw rugs, place hand bars in the bath, shower and toilet areas. Avoid wearing high heeled shoes or walking on wet, snowy, and icy areas. Limit or stop driving if you have vision or hearing problems, or you feel you are unsteady with you movements and  reflexes. HEPATITIS C Hepatitis C is a type of viral infection affecting the liver. It is spread mainly through contact with blood from an infected person. It can be treated, but if left untreated, it can lead to severe liver damage over years. Many people who are infected do not know that the virus is in their blood. If you are a "baby-boomer", it is recommended that you have one screening test for Hepatitis C. IMMUNIZATIONS  Several immunizations are important to consider having during your senior years, including:   Tetanus, diptheria, and pertussis booster shot.  Influenza every year before the flu season begins.  Pneumonia vaccine.  Shingles vaccine.  Others as indicated based on your specific needs. Talk to your caregiver about these. Document Released: 10/03/2005 Document Revised: 07/28/2012 Document Reviewed: 05/29/2008 [employer] Patient Information 2014 [employer].  

## 2013-10-26 LAB — URINALYSIS W MICROSCOPIC + REFLEX CULTURE
Casts: NONE SEEN
GLUCOSE, UA: NEGATIVE mg/dL
Hgb urine dipstick: NEGATIVE
Leukocytes, UA: NEGATIVE
Nitrite: NEGATIVE
PROTEIN: NEGATIVE mg/dL
Specific Gravity, Urine: 1.024 (ref 1.005–1.030)
Urobilinogen, UA: 0.2 mg/dL (ref 0.0–1.0)
pH: 5.5 (ref 5.0–8.0)

## 2013-11-08 ENCOUNTER — Ambulatory Visit: Payer: No Typology Code available for payment source

## 2013-11-11 ENCOUNTER — Ambulatory Visit (INDEPENDENT_AMBULATORY_CARE_PROVIDER_SITE_OTHER): Payer: No Typology Code available for payment source

## 2013-11-11 ENCOUNTER — Ambulatory Visit (INDEPENDENT_AMBULATORY_CARE_PROVIDER_SITE_OTHER): Payer: No Typology Code available for payment source | Admitting: Podiatrist

## 2013-11-11 ENCOUNTER — Encounter: Payer: Self-pay | Admitting: Podiatrist

## 2013-11-11 VITALS — BP 125/68 | HR 72 | Resp 18

## 2013-11-11 DIAGNOSIS — M7750 Other enthesopathy of unspecified foot: Secondary | ICD-10-CM

## 2013-11-11 DIAGNOSIS — M779 Enthesopathy, unspecified: Secondary | ICD-10-CM

## 2013-11-11 DIAGNOSIS — R52 Pain, unspecified: Secondary | ICD-10-CM

## 2013-11-11 DIAGNOSIS — M778 Other enthesopathies, not elsewhere classified: Secondary | ICD-10-CM

## 2013-11-11 NOTE — Progress Notes (Signed)
   Subjective:    Patient ID: Connie Alvarez, female    DOB: 10-27-1960, 53 y.o.   MRN: [id number]  HPI my left foot has been bothering me for about a couple of months and burns and throbs and is sore and tender and it on the ball and on the side of the 5th on my left and it hurts with going barefoot or flat shoes    Review of Systems  Musculoskeletal:       Joint pain  Hematological: Bruises/bleeds easily.  All other systems reviewed and are negative.       Objective:   Physical Exam  GENERAL APPEARANCE: Alert, conversant. Appropriately groomed. No acute distress.  VASCULAR: Pedal pulses palpable at 2/4 DP and PT bilateral.  Capillary refill time is immediate to all digits,  Proximal to distal cooling it warm to warm.  Digital hair growth is present bilateral  NEUROLOGIC: sensation is intact epicritically and protectively to 5.07 monofilament at 5/5 sites bilateral.  Light touch is intact bilateral, vibratory sensation intact bilateral, achilles tendon reflex is intact bilateral.  MUSCULOSKELETAL: Localized pain is present at the fourth metatarsophalangeal joint of the left foot. Generalized discomfort along the lateral foot is also present from the compensation of the foot. Rectus foot type is noted no digital contractures are seen.  DERMATOLOGIC: skin color, texture, and turger are within normal limits.       Assessment & Plan:  Capsulitis left fourth metatarsophalangeal joint Plan: Injected the fourth metatarsophalangeal joint with dexamethasone and Marcaine mixture. Patient will call if this is not beneficial and we will consider orthotics with an offloading pad to take the pressure off of this particular area of discomfort. Otherwise she be seen back as needed.

## 2013-11-14 ENCOUNTER — Other Ambulatory Visit: Payer: Self-pay | Admitting: Gynecology

## 2013-11-14 DIAGNOSIS — Z13820 Encounter for screening for osteoporosis: Secondary | ICD-10-CM

## 2013-12-06 ENCOUNTER — Ambulatory Visit (INDEPENDENT_AMBULATORY_CARE_PROVIDER_SITE_OTHER): Payer: No Typology Code available for payment source

## 2013-12-06 DIAGNOSIS — Z13820 Encounter for screening for osteoporosis: Secondary | ICD-10-CM

## 2014-01-06 ENCOUNTER — Ambulatory Visit (INDEPENDENT_AMBULATORY_CARE_PROVIDER_SITE_OTHER): Payer: No Typology Code available for payment source

## 2014-01-06 ENCOUNTER — Ambulatory Visit (INDEPENDENT_AMBULATORY_CARE_PROVIDER_SITE_OTHER): Payer: No Typology Code available for payment source | Admitting: Podiatrist

## 2014-01-06 ENCOUNTER — Encounter: Payer: Self-pay | Admitting: Podiatrist

## 2014-01-06 VITALS — BP 110/61 | HR 83 | Resp 13 | Ht 65.5 in | Wt 152.0 lb

## 2014-01-06 DIAGNOSIS — M8430XA Stress fracture, unspecified site, initial encounter for fracture: Secondary | ICD-10-CM

## 2014-01-06 DIAGNOSIS — M84376A Stress fracture, unspecified foot, initial encounter for fracture: Secondary | ICD-10-CM

## 2014-01-06 NOTE — Patient Instructions (Signed)
Metatarsal Stress Fracture When too much stress is put on the foot, as in running and jumping sports, the center shaft of the bones of the forefoot is very susceptible to stress fractures (break in bone). This is because of repetitive stress on the bone. This injury is more common if osteoporosis is present   DIAGNOSIS  Usually the diagnosis is made by history. The foot progressively becomes sorer with activities. X-rays may be negative (show no break) within the first 2 to 3 weeks of the beginning of pain. A later X-ray may show signs of healing bone (callus formation). A bone scan or MRI will usually make the diagnosis earlier. TREATMENT AND HOME CARE INSTRUCTIONS  Treatment may or may not include a cast, removable fracture boot, or walking shoe. Casts are used for short periods of time to prevent muscle atrophy (muscle wasting).  Activities should be stopped until further advised by your caregiver.  Wear shoes with adequate shock absorbing abilities.  Alternative exercise may be undertaken while waiting for healing. These may include bicycling and swimming, or as your caregiver suggests. If you do not have a cast or splint:  You may walk on your injured foot as tolerated or advised.  Do not put any weight on your injured foot for as long as directed by your caregiver. Slowly increase the amount of time you walk on the foot as the pain allows or as advised.  Use crutches until you can bear weight without pain. A gradual increase in weight bearing may help.  Apply ice to the injury for 15-20 minutes each hour while awake for the first 2 days. Put the ice in a plastic bag and place a towel between the bag of ice and your skin.  Only take over-the-counter or prescription medicines for pain, discomfort, or fever as directed by your caregiver. SEEK IMMEDIATE MEDICAL CARE IF:   Pain is becoming worse rather than better, or if pain is uncontrolled with medications.  You have increased  swelling or redness in the foot. MAKE SURE YOU:   Understand these instructions.  Will watch your condition.  Will get help right away if you are not doing well or get worse. Document Released: 08/08/2000 Document Revised: 11/03/2011 Document Reviewed: 06/06/2008 [facility] Patient Information 2014 [place].

## 2014-01-06 NOTE — Progress Notes (Signed)
Subjective: Patient presents today for continued pain on the left foot. She states the pain is mostly in the forefoot region and laterally especially when she wears flat shoes. She states the injection given last visit was of no benefit. She continues to have pain in her foot.  Objective physical examination is unchanged. She has continued local pain on the lateral aspect of the left foot and some general discomfort at the metatarsal heads 2, 3 of the left foot. X-rays are taken again for comparison and it does appear that she has a cortical fracture on the lateral aspect the second metatarsal. This is compared to the previous films and noted to be more prominent today.  Assessment: Stress fracture left foot  Plan: I put her in an air fracture walker and she will wear this for 3-4 weeks. I will see her back at that time and we will reassess the pain in her foot. If any problems or concerns arise she is instructed to call she is also to decrease her activity over the next 3 weeks while she is in the boot.

## 2014-02-03 ENCOUNTER — Ambulatory Visit: Payer: No Typology Code available for payment source | Admitting: Podiatrist

## 2014-02-08 ENCOUNTER — Ambulatory Visit (INDEPENDENT_AMBULATORY_CARE_PROVIDER_SITE_OTHER): Payer: No Typology Code available for payment source | Admitting: Podiatrist

## 2014-02-08 ENCOUNTER — Ambulatory Visit (INDEPENDENT_AMBULATORY_CARE_PROVIDER_SITE_OTHER): Payer: No Typology Code available for payment source

## 2014-02-08 DIAGNOSIS — M8430XA Stress fracture, unspecified site, initial encounter for fracture: Secondary | ICD-10-CM

## 2014-02-08 DIAGNOSIS — M8430XS Stress fracture, unspecified site, sequela: Secondary | ICD-10-CM

## 2014-02-08 DIAGNOSIS — IMO0002 Reserved for concepts with insufficient information to code with codable children: Secondary | ICD-10-CM

## 2014-02-09 NOTE — Progress Notes (Signed)
Subjective: Patient presents today for follow up of pain on the left foot.  She states the pain is improving with the use of the darco shoe.  She has been wearing it every day and states the foot is finally feeling better.  Objective physical examination is unchanged. She has some minor discomfort at the metatarsal heads 2, 3 of the left foot.   X-rays are taken again for comparison and it does appear that she has a cortical fracture on the lateral aspect the second metatarsal.   Assessment: Stress fracture left foot   Plan: I recommended she continue to wear the post op shoe for the next week then wean into a good pair of supportive and firm based shoes- hiking or tennis type.  If the pain comes back, she is to go back in the [place].  If the pain does not completely resolve in 3-4 weeks she will call

## 2014-03-08 ENCOUNTER — Ambulatory Visit (INDEPENDENT_AMBULATORY_CARE_PROVIDER_SITE_OTHER): Payer: No Typology Code available for payment source | Admitting: Podiatrist

## 2014-03-08 ENCOUNTER — Encounter: Payer: Self-pay | Admitting: Podiatrist

## 2014-03-08 VITALS — BP 124/70 | HR 75 | Resp 18

## 2014-03-08 DIAGNOSIS — M84375G Stress fracture, left foot, subsequent encounter for fracture with delayed healing: Secondary | ICD-10-CM

## 2014-03-08 DIAGNOSIS — D3613 Benign neoplasm of peripheral nerves and autonomic nervous system of lower limb, including hip: Secondary | ICD-10-CM

## 2014-03-08 DIAGNOSIS — Z4789 Encounter for other orthopedic aftercare: Secondary | ICD-10-CM

## 2014-03-08 DIAGNOSIS — D2120 Benign neoplasm of connective and other soft tissue of unspecified lower limb, including hip: Secondary | ICD-10-CM

## 2014-03-08 NOTE — Patient Instructions (Signed)
Our office will order you an MRI at [facility].  They will call for the appointment date and time.

## 2014-03-08 NOTE — Progress Notes (Signed)
THE LEFT FOOT IS STILL HURTING AND NOT [facility] SWELLING  Patient presents for follow up of the left foot.  She had been wearing the boot and her tennis shoes and has noticed some improvement but overall the foot is still bothersome.    Objective physical examination is unchanged. She has some discomfort at the metatarsal heads 3/4of the left foot-- neuroma is questioned versus stress fracture.   Assessment: neuroma versus Stress fracture left foot   Plan: I recommended an MRI to determine the reason for the foot pain.  Will call with the results of the MRI scan.

## 2014-03-09 ENCOUNTER — Telehealth: Payer: Self-pay | Admitting: *Deleted

## 2014-03-09 NOTE — Telephone Encounter (Signed)
Her order says MRI with Contrast.  If that's the case, it needs to be with and without contrast.  I told her it is supposed to be with contrast.  She asked if she could change the order to with and without contrast.  I told her yes.

## 2014-03-13 ENCOUNTER — Telehealth: Payer: Self-pay | Admitting: *Deleted

## 2014-03-13 NOTE — Telephone Encounter (Signed)
We need an order faxed to [place].  She is scheduled for an MRI on tomorrow.  I faxed the order.

## 2014-03-14 ENCOUNTER — Telehealth: Payer: Self-pay | Admitting: *Deleted

## 2014-03-14 ENCOUNTER — Ambulatory Visit
Admission: RE | Admit: 2014-03-14 | Discharge: 2014-03-14 | Disposition: A | Discharge status: Home / Self Care | Payer: No Typology Code available for payment source | Source: Ambulatory Visit | Attending: Podiatrist | Admitting: Podiatrist

## 2014-03-14 DIAGNOSIS — D3613 Benign neoplasm of peripheral nerves and autonomic nervous system of lower limb, including hip: Secondary | ICD-10-CM

## 2014-03-14 DIAGNOSIS — M84375G Stress fracture, left foot, subsequent encounter for fracture with delayed healing: Secondary | ICD-10-CM

## 2014-03-14 MED ORDER — GADOBENATE DIMEGLUMINE 529 MG/ML IV SOLN
7.0000 mL | Freq: Once | INTRAVENOUS | Status: AC | PRN
  Administered 2014-03-14: 7 mL via INTRAVENOUS

## 2014-03-14 NOTE — Telephone Encounter (Signed)
I called and gave her the authorization number for the MRI which is good for today only.  The number is [id number].

## 2014-03-14 NOTE — Telephone Encounter (Signed)
Need authorization from [facility] for her MRI.  She's coming in tomorrow.  The procedure code is [id number].

## 2014-03-14 NOTE — Telephone Encounter (Signed)
I called and spoke to [place] who took general information.  She transferred me to [facility] for clinical information.  Anda Kraft said the MRI was approved for today only.  Authorization number is [id number].

## 2014-03-16 ENCOUNTER — Telehealth: Payer: Self-pay | Admitting: *Deleted

## 2014-03-16 NOTE — Telephone Encounter (Signed)
Referral sent for physical therapy per Dr. Valentina Lucks to [facility]. Diagnosis: Metatarsalgia left foot, Evaluate and Treat 3 times a week for 4 weeks  [facility] [address] [place], [place] [address] Ph: [phone]

## 2014-03-16 NOTE — Telephone Encounter (Signed)
Patient returned my call.  I informed her that Dr. Valentina Lucks got her MRI results back.  They didn't show a Neuroma nor a fracture.  Just show some mild swelling.  I told her Dr. Valentina Lucks recommends physical therapy if you'd like to try that.  She stated yes, I'll try anything to help my foot.  I told her she will refer her to [facility].  I gave her the address and the phone.  I advised her to call at her convenience to schedule.

## 2014-03-16 NOTE — Telephone Encounter (Signed)
Message copied by Lolita Rieger on Thu Mar 16, 2014 12:45 PM ------      Message from: Bronson Ing      Created: Tue Mar 14, 2014  2:54 PM      Regarding: mri result       Can you let the patient know there is no fracture and no neuroma present.  Just some mild swelling in between the bones of the foot (which was read to be a normal finding) -- would recommend physical therapy to calm the foot down.            She does not need to see me to go over the result-- PT is the best option-- [facility] who I would [facility] her to.            Thanks!            E      ----- Message -----         From: Rad Results In Interface         Sent: 03/14/2014  12:55 PM           To: Trudie Buckler, DPM                   ------

## 2014-03-16 NOTE — Telephone Encounter (Signed)
I left the patient a message to call me back.

## 2014-03-24 ENCOUNTER — Ambulatory Visit: Payer: No Typology Code available for payment source | Admitting: Podiatrist

## 2014-06-26 ENCOUNTER — Encounter: Payer: Self-pay | Admitting: Podiatrist

## 2014-11-04 ENCOUNTER — Other Ambulatory Visit: Payer: Self-pay | Admitting: Women's Health

## 2014-11-08 ENCOUNTER — Other Ambulatory Visit: Payer: Self-pay

## 2014-11-08 DIAGNOSIS — Z1231 Encounter for screening mammogram for malignant neoplasm of breast: Secondary | ICD-10-CM

## 2014-11-22 ENCOUNTER — Ambulatory Visit (INDEPENDENT_AMBULATORY_CARE_PROVIDER_SITE_OTHER): Payer: BLUE CROSS/BLUE SHIELD | Admitting: Women's Health

## 2014-11-22 ENCOUNTER — Ambulatory Visit
Admission: RE | Admit: 2014-11-22 | Discharge: 2014-11-22 | Disposition: A | Discharge status: Home / Self Care | Payer: BLUE CROSS/BLUE SHIELD | Source: Ambulatory Visit

## 2014-11-22 ENCOUNTER — Encounter: Payer: Self-pay | Admitting: Women's Health

## 2014-11-22 ENCOUNTER — Other Ambulatory Visit: Payer: Self-pay

## 2014-11-22 VITALS — BP 122/78 | Ht 65.0 in | Wt 161.0 lb

## 2014-11-22 DIAGNOSIS — Z79890 Hormone replacement therapy (postmenopausal): Secondary | ICD-10-CM | POA: Diagnosis not present

## 2014-11-22 DIAGNOSIS — Z1231 Encounter for screening mammogram for malignant neoplasm of breast: Secondary | ICD-10-CM

## 2014-11-22 DIAGNOSIS — Z01419 Encounter for gynecological examination (general) (routine) without abnormal findings: Secondary | ICD-10-CM

## 2014-11-22 MED ORDER — ESTRADIOL 0.5 MG PO TABS: 0.5000 mg | ORAL_TABLET | Freq: Every day | ORAL | Status: DC

## 2014-11-22 NOTE — Progress Notes (Signed)
Connie Alvarez [birth year] [id number]    History:    Presents for annual exam.  2002 TAH with BSO for endometriosis on estradiol 0.5 daily. History of lichen sclerosis with minimal problems. 2011 benign colon polyp has scheduled follow-up this year. Normal Pap and mammogram history.  Past medical history, past surgical history, family history and social history were all reviewed and documented in the EPIC chart. Works part time, oldest daughter lives in [place] and travels, other daughter attorney in [place] both doing well. Has 8 siblings, mother diabetes and hypertension.  ROS:  A ROS was performed and pertinent positives and negatives are included.  Exam:  Filed Vitals:   11/22/14 1048  BP: 122/78    General appearance:  Normal Thyroid:  Symmetrical, normal in size, without palpable masses or nodularity. Respiratory  Auscultation:  Clear without wheezing or rhonchi Cardiovascular  Auscultation:  Regular rate, without rubs, murmurs or gallops  Edema/varicosities:  Not grossly evident Abdominal  Soft,nontender, without masses, guarding or rebound.  Liver/spleen:  No organomegaly noted  Hernia:  None appreciated  Skin  Inspection:  Grossly normal   Breasts: Examined lying and sitting.     Right: Without masses, retractions, discharge or axillary adenopathy.     Left: Without masses, retractions, discharge or axillary adenopathy. Gentitourinary   Inguinal/mons:  Normal without inguinal adenopathy  External genitalia:  Normal small non-irritated area of lichen sclerosis at posterior fourchette  BUS/Urethra/Skene's glands:  Normal  Vagina:  Normal  Cervix:  And uterus absent  Adnexa/parametria:     Rt: Without masses or tenderness.   Lt: Without masses or tenderness.  Anus and perineum: Normal  Digital rectal exam: Normal sphincter tone without palpated masses or tenderness  Assessment/Plan:  54 y.o. MWF G2 P2  for annual exam with no complaints  2002 TAH with BSO for  endometriosis on estradiol Lichen sclerosis  Plan: Estradiol 0.5 mg by mouth daily prescription, proper use, slight risk for blood clots and strokes breast cancer. Reviewed best to use shortest amount of time, states continues to have some hot flushes. Temovate pre-when necessary for lichen sclerosis. SBE's, continue annual screening mammogram 3-D tomography reviewed and encouraged history of dense breasts has scheduled. Keep scheduled follow-up for colonoscopy 2011 benign colon polyp. Labs at primary care. UA. Schedule DEXA, home safety, fall prevention and importance of regular weightbearing exercise reviewed. Has had problems with foot pain has follow-up scheduled.     Huel Cote WHNP, 1:19 PM 11/22/2014

## 2014-11-22 NOTE — Patient Instructions (Signed)
Health Recommendations for Postmenopausal Women Respected and ongoing research has looked at the most common causes of death, disability, and poor quality of life in postmenopausal women. The causes include heart disease, diseases of blood vessels, diabetes, depression, cancer, and bone loss (osteoporosis). Many things can be done to help lower the chances of developing these and other common problems. CARDIOVASCULAR DISEASE Heart Disease: A heart attack is a medical emergency. Know the signs and symptoms of a heart attack. Below are things women can do to reduce their risk for heart disease.   Do not smoke. If you smoke, quit.  Aim for a healthy weight. Being overweight causes many preventable deaths. Eat a healthy and balanced diet and drink an adequate amount of liquids.  Get moving. Make a commitment to be more physically active. Aim for 30 minutes of activity on most, if not all days of the week.  Eat for heart health. Choose a diet that is low in saturated fat and cholesterol and eliminate trans fat. Include whole grains, vegetables, and fruits. Read and understand the labels on food containers before buying.  Know your numbers. Ask your caregiver to check your blood pressure, cholesterol (total, HDL, LDL, triglycerides) and blood glucose. Work with your caregiver on improving your entire clinical picture.  High blood pressure. Limit or stop your table salt intake (try salt substitute and food seasonings). Avoid salty foods and drinks. Read labels on food containers before buying. Eating well and exercising can help control high blood pressure. STROKE  Stroke is a medical emergency. Stroke may be the result of a blood clot in a blood vessel in the brain or by a brain hemorrhage (bleeding). Know the signs and symptoms of a stroke. To lower the risk of developing a stroke:  Avoid fatty foods.  Quit smoking.  Control your diabetes, blood pressure, and irregular heart rate. THROMBOPHLEBITIS  (BLOOD CLOT) OF THE LEG  Becoming overweight and leading a stationary lifestyle may also contribute to developing blood clots. Controlling your diet and exercising will help lower the risk of developing blood clots. CANCER SCREENING  Breast Cancer: Take steps to reduce your risk of breast cancer.  You should practice "breast self-awareness." This means understanding the normal appearance and feel of your breasts and should include breast self-examination. Any changes detected, no matter how small, should be reported to your caregiver.  After age 40, you should have a clinical breast exam (CBE) every year.  Starting at age 40, you should consider having a mammogram (breast X-ray) every year.  If you have a family history of breast cancer, talk to your caregiver about genetic screening.  If you are at high risk for breast cancer, talk to your caregiver about having an MRI and a mammogram every year.  Intestinal or Stomach Cancer: Tests to consider are a rectal exam, fecal occult blood, sigmoidoscopy, and colonoscopy. Women who are high risk may need to be screened at an earlier age and more often.  Cervical Cancer:  Beginning at age 30, you should have a Pap test every 3 years as long as the past 3 Pap tests have been normal.  If you have had past treatment for cervical cancer or a condition that could lead to cancer, you need Pap tests and screening for cancer for at least 20 years after your treatment.  If you had a hysterectomy for a problem that was not cancer or a condition that could lead to cancer, then you no longer need Pap tests.    If you are between ages 65 and 70, and you have had normal Pap tests going back 10 years, you no longer need Pap tests.  If Pap tests have been discontinued, risk factors (such as a new sexual partner) need to be reassessed to determine if screening should be resumed.  Some medical problems can increase the chance of getting cervical cancer. In these  cases, your caregiver may recommend more frequent screening and Pap tests.  Uterine Cancer: If you have vaginal bleeding after reaching menopause, you should notify your caregiver.  Ovarian Cancer: Other than yearly pelvic exams, there are no reliable tests available to screen for ovarian cancer at this time except for yearly pelvic exams.  Lung Cancer: Yearly chest X-rays can detect lung cancer and should be done on high risk women, such as cigarette smokers and women with chronic lung disease (emphysema).  Skin Cancer: A complete body skin exam should be done at your yearly examination. Avoid overexposure to the sun and ultraviolet light lamps. Use a strong sun block cream when in the sun. All of these things are important for lowering the risk of skin cancer. MENOPAUSE Menopause Symptoms: Hormone therapy products are effective for treating symptoms associated with menopause:  Moderate to severe hot flashes.  Night sweats.  Mood swings.  Headaches.  Tiredness.  Loss of sex drive.  Insomnia.  Other symptoms. Hormone replacement carries certain risks, especially in older women. Women who use or are thinking about using estrogen or estrogen with progestin treatments should discuss that with their caregiver. Your caregiver will help you understand the benefits and risks. The ideal dose of hormone replacement therapy is not known. The Food and Drug Administration (FDA) has concluded that hormone therapy should be used only at the lowest doses and for the shortest amount of time to reach treatment goals.  OSTEOPOROSIS Protecting Against Bone Loss and Preventing Fracture If you use hormone therapy for prevention of bone loss (osteoporosis), the risks for bone loss must outweigh the risk of the therapy. Ask your caregiver about other medications known to be safe and effective for preventing bone loss and fractures. To guard against bone loss or fractures, the following is recommended:  If  you are younger than age 50, take 1000 mg of calcium and at least 600 mg of Vitamin D per day.  If you are older than age 50 but younger than age 70, take 1200 mg of calcium and at least 600 mg of Vitamin D per day.  If you are older than age 70, take 1200 mg of calcium and at least 800 mg of Vitamin D per day. Smoking and excessive alcohol intake increases the risk of osteoporosis. Eat foods rich in calcium and vitamin D and do weight bearing exercises several times a week as your caregiver suggests. DIABETES Diabetes Mellitus: If you have type I or type 2 diabetes, you should keep your blood sugar under control with diet, exercise, and recommended medication. Avoid starchy and fatty foods, and too many sweets. Being overweight can make diabetes control more difficult. COGNITION AND MEMORY Cognition and Memory: Menopausal hormone therapy is not recommended for the prevention of cognitive disorders such as Alzheimer's disease or memory loss.  DEPRESSION  Depression may occur at any age, but it is common in elderly women. This may be because of physical, medical, social (loneliness), or financial problems and needs. If you are experiencing depression because of medical problems and control of symptoms, talk to your caregiver about this. Physical   activity and exercise may help with mood and sleep. Community and volunteer involvement may improve your sense of value and worth. If you have depression and you feel that the problem is getting worse or becoming severe, talk to your caregiver about which treatment options are best for you. ACCIDENTS  Accidents are common and can be serious in elderly woman. Prepare your house to prevent accidents. Eliminate throw rugs, place hand bars in bath, shower, and toilet areas. Avoid wearing high heeled shoes or walking on wet, snowy, and icy areas. Limit or stop driving if you have vision or hearing problems, or if you feel you are unsteady with your movements and  reflexes. HEPATITIS C Hepatitis C is a type of viral infection affecting the liver. It is spread mainly through contact with blood from an infected person. It can be treated, but if left untreated, it can lead to severe liver damage over the years. Many people who are infected do not know that the virus is in their blood. If you are a "baby-boomer", it is recommended that you have one screening test for Hepatitis C. IMMUNIZATIONS  Several immunizations are important to consider having during your senior years, including:   Tetanus, diphtheria, and pertussis booster shot.  Influenza every year before the flu season begins.  Pneumonia vaccine.  Shingles vaccine.  Others, as indicated based on your specific needs. Talk to your caregiver about these. Document Released: 10/03/2005 Document Revised: 12/26/2013 Document Reviewed: 05/29/2008 [employer] Patient Information 2015 [employer]. This information is not intended to replace advice given to you by your health care provider. Make sure you discuss any questions you have with your health care provider.  

## 2014-11-23 LAB — URINALYSIS W MICROSCOPIC + REFLEX CULTURE
Bilirubin Urine: NEGATIVE
Casts: NONE SEEN
Crystals: NONE SEEN
Glucose, UA: NEGATIVE mg/dL
HGB URINE DIPSTICK: NEGATIVE
KETONES UR: NEGATIVE mg/dL
Leukocytes, UA: NEGATIVE
NITRITE: NEGATIVE
Protein, ur: NEGATIVE mg/dL
Specific Gravity, Urine: 1.015 (ref 1.005–1.030)
Squamous Epithelial / LPF: NONE SEEN
UROBILINOGEN UA: 0.2 mg/dL (ref 0.0–1.0)
pH: 6 (ref 5.0–8.0)

## 2014-11-24 ENCOUNTER — Encounter: Payer: Self-pay | Admitting: Women's Health

## 2014-11-25 LAB — URINE CULTURE

## 2014-11-28 ENCOUNTER — Other Ambulatory Visit: Payer: Self-pay | Admitting: Women's Health

## 2014-11-28 ENCOUNTER — Other Ambulatory Visit: Payer: Self-pay | Admitting: Anesthesiology

## 2014-11-28 ENCOUNTER — Other Ambulatory Visit: Payer: BLUE CROSS/BLUE SHIELD

## 2014-11-28 DIAGNOSIS — N3000 Acute cystitis without hematuria: Secondary | ICD-10-CM

## 2014-11-28 DIAGNOSIS — Z79890 Hormone replacement therapy (postmenopausal): Secondary | ICD-10-CM

## 2014-11-29 LAB — URINALYSIS W MICROSCOPIC + REFLEX CULTURE
Bilirubin Urine: NEGATIVE
Casts: NONE SEEN
Crystals: NONE SEEN
Glucose, UA: NEGATIVE mg/dL
HGB URINE DIPSTICK: NEGATIVE
KETONES UR: NEGATIVE mg/dL
Leukocytes, UA: NEGATIVE
Nitrite: NEGATIVE
Protein, ur: NEGATIVE mg/dL
Specific Gravity, Urine: 1.025 (ref 1.005–1.030)
Urobilinogen, UA: 0.2 mg/dL (ref 0.0–1.0)
pH: 5.5 (ref 5.0–8.0)

## 2014-12-19 ENCOUNTER — Other Ambulatory Visit: Payer: Self-pay | Admitting: Gynecology

## 2014-12-19 DIAGNOSIS — Z79890 Hormone replacement therapy (postmenopausal): Secondary | ICD-10-CM

## 2015-02-15 ENCOUNTER — Ambulatory Visit (INDEPENDENT_AMBULATORY_CARE_PROVIDER_SITE_OTHER): Payer: BLUE CROSS/BLUE SHIELD

## 2015-02-15 ENCOUNTER — Other Ambulatory Visit: Payer: Self-pay | Admitting: Gynecology

## 2015-02-15 DIAGNOSIS — Z13820 Encounter for screening for osteoporosis: Secondary | ICD-10-CM

## 2015-02-15 DIAGNOSIS — Z79890 Hormone replacement therapy (postmenopausal): Secondary | ICD-10-CM

## 2015-10-30 ENCOUNTER — Other Ambulatory Visit: Payer: Self-pay

## 2015-10-30 DIAGNOSIS — Z1231 Encounter for screening mammogram for malignant neoplasm of breast: Secondary | ICD-10-CM

## 2015-11-26 ENCOUNTER — Ambulatory Visit
Admission: RE | Admit: 2015-11-26 | Discharge: 2015-11-26 | Disposition: A | Discharge status: Home / Self Care | Payer: 59 | Source: Ambulatory Visit

## 2015-11-26 DIAGNOSIS — Z1231 Encounter for screening mammogram for malignant neoplasm of breast: Secondary | ICD-10-CM

## 2015-11-27 ENCOUNTER — Encounter: Payer: BLUE CROSS/BLUE SHIELD | Admitting: Women's Health

## 2015-12-05 ENCOUNTER — Encounter: Payer: Self-pay | Admitting: Women's Health

## 2015-12-05 ENCOUNTER — Ambulatory Visit (INDEPENDENT_AMBULATORY_CARE_PROVIDER_SITE_OTHER): Payer: 59 | Admitting: Women's Health

## 2015-12-05 VITALS — BP 118/78 | Ht 65.0 in | Wt 163.0 lb

## 2015-12-05 DIAGNOSIS — L900 Lichen sclerosus et atrophicus: Secondary | ICD-10-CM

## 2015-12-05 DIAGNOSIS — Z13220 Encounter for screening for lipoid disorders: Secondary | ICD-10-CM

## 2015-12-05 DIAGNOSIS — Z79890 Hormone replacement therapy (postmenopausal): Secondary | ICD-10-CM | POA: Diagnosis not present

## 2015-12-05 DIAGNOSIS — Z1329 Encounter for screening for other suspected endocrine disorder: Secondary | ICD-10-CM | POA: Diagnosis not present

## 2015-12-05 DIAGNOSIS — K635 Polyp of colon: Secondary | ICD-10-CM

## 2015-12-05 DIAGNOSIS — Z01419 Encounter for gynecological examination (general) (routine) without abnormal findings: Secondary | ICD-10-CM | POA: Diagnosis not present

## 2015-12-05 LAB — CBC WITH DIFFERENTIAL/PLATELET
Basophils Absolute: 37 cells/uL (ref 0–200)
Basophils Relative: 1 %
EOS PCT: 5 %
Eosinophils Absolute: 185 cells/uL (ref 15–500)
HCT: 36.6 % (ref 35.0–45.0)
HEMOGLOBIN: 11.8 g/dL (ref 11.7–15.5)
LYMPHS PCT: 28 %
Lymphs Abs: 1036 cells/uL (ref 850–3900)
MCH: 28.4 pg (ref 27.0–33.0)
MCHC: 32.2 g/dL (ref 32.0–36.0)
MCV: 88 fL (ref 80.0–100.0)
MONO ABS: 296 {cells}/uL (ref 200–950)
MPV: 9.1 fL (ref 7.5–12.5)
Monocytes Relative: 8 %
NEUTROS PCT: 58 %
Neutro Abs: 2146 cells/uL (ref 1500–7800)
Platelets: 272 10*3/uL (ref 140–400)
RBC: 4.16 MIL/uL (ref 3.80–5.10)
RDW: 14 % (ref 11.0–15.0)
WBC: 3.7 10*3/uL — ABNORMAL LOW (ref 3.8–10.8)

## 2015-12-05 LAB — COMPREHENSIVE METABOLIC PANEL
ALBUMIN: 3.9 g/dL (ref 3.6–5.1)
ALT: 12 U/L (ref 6–29)
AST: 16 U/L (ref 10–35)
Alkaline Phosphatase: 52 U/L (ref 33–130)
BUN: 23 mg/dL (ref 7–25)
CALCIUM: 9.3 mg/dL (ref 8.6–10.4)
CO2: 26 mmol/L (ref 20–31)
Chloride: 104 mmol/L (ref 98–110)
Creat: 0.75 mg/dL (ref 0.50–1.05)
Glucose, Bld: 89 mg/dL (ref 65–99)
Potassium: 3.9 mmol/L (ref 3.5–5.3)
Sodium: 139 mmol/L (ref 135–146)
Total Bilirubin: 0.4 mg/dL (ref 0.2–1.2)
Total Protein: 6.5 g/dL (ref 6.1–8.1)

## 2015-12-05 LAB — LIPID PANEL
CHOL/HDL RATIO: 3.7 ratio (ref <=5.0)
Cholesterol: 193 mg/dL (ref 125–200)
HDL: 52 mg/dL (ref >=46)
LDL CALC: 110 mg/dL (ref <130)
TRIGLYCERIDES: 155 mg/dL — AB (ref <150)
VLDL: 31 mg/dL — ABNORMAL HIGH (ref <30)

## 2015-12-05 LAB — TSH: TSH: 2.8 mIU/L

## 2015-12-05 MED ORDER — CLOBETASOL PROPIONATE 0.05 % EX CREA: 1.0000 "application " | TOPICAL_CREAM | Freq: Two times a day (BID) | CUTANEOUS | Status: DC

## 2015-12-05 MED ORDER — ESTRADIOL 0.5 MG PO TABS: 0.5000 mg | ORAL_TABLET | Freq: Every day | ORAL | Status: DC

## 2015-12-05 NOTE — Patient Instructions (Signed)
Lichen Sclerosus Lichen sclerosus is a skin problem. It can happen on any part of the body, but it commonly involves the anal or genital areas. It can cause itching and discomfort in these areas. Treatment can help to control symptoms. When the genital area is affected, getting treatment is important because the condition can cause scarring that may lead to other problems. CAUSES The cause of this condition is not known. It could be the result of an overactive immune system or a lack of certain hormones. Lichen sclerosus is not an infection or a fungus. It is not passed from one person to another (not contagious). RISK FACTORS This condition is more likely to develop in women, usually after menopause. SYMPTOMS Symptoms of this condition include:  Thin, wrinkled, white areas on the skin.  Thickened white areas on the skin.  Red and swollen patches (lesions) on the skin.  Tears or cracks in the skin.  Bruising.  Blood blisters.  Severe itching. You may also have pain, itching, or burning with urination. Constipation is also common in people with lichen sclerosus. DIAGNOSIS This condition may be diagnosed with a physical exam. In some cases, a tissue sample (biopsy sample) may be removed to be looked at under a microscope. TREATMENT This condition is usually treated with medicated creams or ointments (topical steroids) that are applied over the affected areas. HOME CARE INSTRUCTIONS  Take over-the-counter and prescription medicines only as told by your health care provider.  Use creams or ointments as told by your health care provider.  Do not scratch the affected areas of skin.  Women should keep the vaginal area as clean and dry as possible.  Keep all follow-up visits as told by your health care provider. This is important. SEEK MEDICAL CARE IF:  You have increasing redness, swelling, or pain in the affected area.  You have fluid, blood, or pus coming from the affected  area.  You have new lesions on your skin.  You have pain or burning with urination.  You have pain during sex.   This information is not intended to replace advice given to you by your health care provider. Make sure you discuss any questions you have with your health care provider.   Document Released: 01/01/2011 Document Revised: 05/02/2015 Document Reviewed: 11/06/2014 Elsevier Interactive Patient Education [employer] Menopause is a normal process in which your reproductive ability comes to an end. This process happens gradually over a span of months to years, usually between the ages of 76 and 91. Menopause is complete when you have missed 12 consecutive menstrual periods. It is important to talk with your health care provider about some of the most common conditions that affect postmenopausal women, such as heart disease, cancer, and bone loss (osteoporosis). Adopting a healthy lifestyle and getting preventive care can help to promote your health and wellness. Those actions can also lower your chances of developing some of these common conditions. WHAT SHOULD I KNOW ABOUT MENOPAUSE? During menopause, you may experience a number of symptoms, such as:  Moderate-to-severe hot flashes.  Night sweats.  Decrease in sex drive.  Mood swings.  Headaches.  Tiredness.  Irritability.  Memory problems.  Insomnia. Choosing to treat or not to treat menopausal changes is an individual decision that you make with your health care provider. WHAT SHOULD I KNOW ABOUT HORMONE REPLACEMENT THERAPY AND SUPPLEMENTS? Hormone therapy products are effective for treating symptoms that are associated with menopause, such as hot flashes and night sweats. Hormone replacement  carries certain risks, especially as you become older. If you are thinking about using estrogen or estrogen with progestin treatments, discuss the benefits and risks with your health care provider. WHAT SHOULD I KNOW ABOUT  HEART DISEASE AND STROKE? Heart disease, heart attack, and stroke become more likely as you age. This may be due, in part, to the hormonal changes that your body experiences during menopause. These can affect how your body processes dietary fats, triglycerides, and cholesterol. Heart attack and stroke are both medical emergencies. There are many things that you can do to help prevent heart disease and stroke:  Have your blood pressure checked at least every 1-2 years. High blood pressure causes heart disease and increases the risk of stroke.  If you are 22-56 years old, ask your health care provider if you should take aspirin to prevent a heart attack or a stroke.  Do not use any tobacco products, including cigarettes, chewing tobacco, or electronic cigarettes. If you need help quitting, ask your health care provider.  It is important to eat a healthy diet and maintain a healthy weight.  Be sure to include plenty of vegetables, fruits, low-fat dairy products, and lean protein.  Avoid eating foods that are high in solid fats, added sugars, or salt (sodium).  Get regular exercise. This is one of the most important things that you can do for your health.  Try to exercise for at least 150 minutes each week. The type of exercise that you do should increase your heart rate and make you sweat. This is known as moderate-intensity exercise.  Try to do strengthening exercises at least twice each week. Do these in addition to the moderate-intensity exercise.  Know your numbers.Ask your health care provider to check your cholesterol and your blood glucose. Continue to have your blood tested as directed by your health care provider. WHAT SHOULD I KNOW ABOUT CANCER SCREENING? There are several types of cancer. Take the following steps to reduce your risk and to catch any cancer development as early as possible. Breast Cancer  Practice breast self-awareness.  This means understanding how your breasts  normally appear and feel.  It also means doing regular breast self-exams. Let your health care provider know about any changes, no matter how small.  If you are 90 or older, have a clinician do a breast exam (clinical breast exam or CBE) every year. Depending on your age, family history, and medical history, it may be recommended that you also have a yearly breast X-ray (mammogram).  If you have a family history of breast cancer, talk with your health care provider about genetic screening.  If you are at high risk for breast cancer, talk with your health care provider about having an MRI and a mammogram every year.  Breast cancer (BRCA) gene test is recommended for women who have family members with BRCA-related cancers. Results of the assessment will determine the need for genetic counseling and BRCA1 and for BRCA2 testing. BRCA-related cancers include these types:  Breast. This occurs in males or females.  Ovarian.  Tubal. This may also be called fallopian tube cancer.  Cancer of the abdominal or pelvic lining (peritoneal cancer).  Prostate.  Pancreatic. Cervical, Uterine, and Ovarian Cancer Your health care provider may recommend that you be screened regularly for cancer of the pelvic organs. These include your ovaries, uterus, and vagina. This screening involves a pelvic exam, which includes checking for microscopic changes to the surface of your cervix (Pap test).  For  women ages 21-65, health care providers may recommend a pelvic exam and a Pap test every three years. For women ages 58-65, they may recommend the Pap test and pelvic exam, combined with testing for human papilloma virus (HPV), every five years. Some types of HPV increase your risk of cervical cancer. Testing for HPV may also be done on women of any age who have unclear Pap test results.  Other health care providers may not recommend any screening for nonpregnant women who are considered low risk for pelvic cancer and  have no symptoms. Ask your health care provider if a screening pelvic exam is right for you.  If you have had past treatment for cervical cancer or a condition that could lead to cancer, you need Pap tests and screening for cancer for at least 20 years after your treatment. If Pap tests have been discontinued for you, your risk factors (such as having a new sexual partner) need to be reassessed to determine if you should start having screenings again. Some women have medical problems that increase the chance of getting cervical cancer. In these cases, your health care provider may recommend that you have screening and Pap tests more often.  If you have a family history of uterine cancer or ovarian cancer, talk with your health care provider about genetic screening.  If you have vaginal bleeding after reaching menopause, tell your health care provider.  There are currently no reliable tests available to screen for ovarian cancer. Lung Cancer Lung cancer screening is recommended for adults 62-26 years old who are at high risk for lung cancer because of a history of smoking. A yearly low-dose CT scan of the lungs is recommended if you:  Currently smoke.  Have a history of at least 30 pack-years of smoking and you currently smoke or have quit within the past 15 years. A pack-year is smoking an average of one pack of cigarettes per day for one year. Yearly screening should:  Continue until it has been 15 years since you quit.  Stop if you develop a health problem that would prevent you from having lung cancer treatment. Colorectal Cancer  This type of cancer can be detected and can often be prevented.  Routine colorectal cancer screening usually begins at age 32 and continues through age 40.  If you have risk factors for colon cancer, your health care provider may recommend that you be screened at an earlier age.  If you have a family history of colorectal cancer, talk with your health care  provider about genetic screening.  Your health care provider may also recommend using home test kits to check for hidden blood in your stool.  A small camera at the end of a tube can be used to examine your colon directly (sigmoidoscopy or colonoscopy). This is done to check for the earliest forms of colorectal cancer.  Direct examination of the colon should be repeated every 5-10 years until age 30. However, if early forms of precancerous polyps or small growths are found or if you have a family history or genetic risk for colorectal cancer, you may need to be screened more often. Skin Cancer  Check your skin from head to toe regularly.  Monitor any moles. Be sure to tell your health care provider:  About any new moles or changes in moles, especially if there is a change in a mole's shape or color.  If you have a mole that is larger than the size of a pencil eraser.  If any of your family members has a history of skin cancer, especially at a Samvel Zinn age, talk with your health care provider about genetic screening.  Always use sunscreen. Apply sunscreen liberally and repeatedly throughout the day.  Whenever you are outside, protect yourself by wearing long sleeves, pants, a wide-brimmed hat, and sunglasses. WHAT SHOULD I KNOW ABOUT OSTEOPOROSIS? Osteoporosis is a condition in which bone destruction happens more quickly than new bone creation. After menopause, you may be at an increased risk for osteoporosis. To help prevent osteoporosis or the bone fractures that can happen because of osteoporosis, the following is recommended:  If you are 50-86 years old, get at least 1,000 mg of calcium and at least 600 mg of vitamin D per day.  If you are older than age 30 but younger than age 61, get at least 1,200 mg of calcium and at least 600 mg of vitamin D per day.  If you are older than age 40, get at least 1,200 mg of calcium and at least 800 mg of vitamin D per day. Smoking and excessive  alcohol intake increase the risk of osteoporosis. Eat foods that are rich in calcium and vitamin D, and do weight-bearing exercises several times each week as directed by your health care provider. WHAT SHOULD I KNOW ABOUT HOW MENOPAUSE AFFECTS Eugenio Saenz? Depression may occur at any age, but it is more common as you become older. Common symptoms of depression include:  Low or sad mood.  Changes in sleep patterns.  Changes in appetite or eating patterns.  Feeling an overall lack of motivation or enjoyment of activities that you previously enjoyed.  Frequent crying spells. Talk with your health care provider if you think that you are experiencing depression. WHAT SHOULD I KNOW ABOUT IMMUNIZATIONS? It is important that you get and maintain your immunizations. These include:  Tetanus, diphtheria, and pertussis (Tdap) booster vaccine.  Influenza every year before the flu season begins.  Pneumonia vaccine.  Shingles vaccine. Your health care provider may also recommend other immunizations.   This information is not intended to replace advice given to you by your health care provider. Make sure you discuss any questions you have with your health care provider.   Document Released: 10/03/2005 Document Revised: 09/01/2014 Document Reviewed: 04/13/2014 Elsevier Interactive Patient Education [employer].

## 2015-12-05 NOTE — Progress Notes (Signed)
Connie Alvarez 1961-03-06 [id number]    History:    Presents for annual exam.  Thousand 2 TAH with BSO from endometriosis on estradiol 0.5 daily. History of lichen sclerosis uses occasional Temovate. 2016 normal DEXA. 2011 benign colon polyp, 2016 negative colonoscopy. Normal Pap and mammogram history.  Past medical history, past surgical history, family history and social history were all reviewed and documented in the EPIC chart. Office work. 2 daughters 1 living in [place], one in [place] getting married this year. 8 siblings one with breast cancer. Mother diabetes and hypertension age 62. Originally from [place].  ROS:  A ROS was performed and pertinent positives and negatives are included.  Exam:  Filed Vitals:   12/05/15 0826  BP: 118/78    General appearance:  Normal Thyroid:  Symmetrical, normal in size, without palpable masses or nodularity. Respiratory  Auscultation:  Clear without wheezing or rhonchi Cardiovascular  Auscultation:  Regular rate, without rubs, murmurs or gallops  Edema/varicosities:  Not grossly evident Abdominal  Soft,nontender, without masses, guarding or rebound.  Liver/spleen:  No organomegaly noted  Hernia:  None appreciated  Skin  Inspection:  Grossly normal   Breasts: Examined lying and sitting.     Right: Without masses, retractions, discharge or axillary adenopathy.     Left: Without masses, retractions, discharge or axillary adenopathy. Gentitourinary   Inguinal/mons:  Normal without inguinal adenopathy  External genitalia:  Normal  BUS/Urethra/Skene's glands:  Normal  Vagina:  Normal  Cervix:  And uterus absent  Adnexa/parametria:     Rt: Without masses or tenderness.   Lt: Without masses or tenderness.  Anus and perineum: Normal  Digital rectal exam: Normal sphincter tone without palpated masses or tenderness  Assessment/Plan:  55 y.o. M WF G2 P2 for annual exam with no complaints.  2002 TAH with BSO for endometriosis on  estradiol Lichen sclerosis  Plan: HRT reviewed best to be on shortest amount of time, estradiol 0.5 prescription, proper use given and reviewed slight risk for blood clots, strokes and breast cancer. SBE's, continue annual 3-D screening mammogram history of dense breasts. Exercise, calcium rich diet, vitamin D 2000 daily encouraged. Temovate use sparingly, prescription given. CBC, CMP, lipid panel, vitamin D, TSH, UA.  Delorise Hunkele J WHNP, 1:05 PM 12/05/2015

## 2015-12-06 LAB — URINALYSIS W MICROSCOPIC + REFLEX CULTURE
Bacteria, UA: NONE SEEN [HPF]
Bilirubin Urine: NEGATIVE
CRYSTALS: NONE SEEN [HPF]
Casts: NONE SEEN [LPF]
Glucose, UA: NEGATIVE
Hgb urine dipstick: NEGATIVE
KETONES UR: NEGATIVE
Leukocytes, UA: NEGATIVE
NITRITE: NEGATIVE
PH: 5.5 (ref 5.0–8.0)
Protein, ur: NEGATIVE
RBC / HPF: NONE SEEN RBC/HPF (ref <=2)
SPECIFIC GRAVITY, URINE: 1.012 (ref 1.001–1.035)
Squamous Epithelial / LPF: NONE SEEN [HPF] (ref <=5)
WBC, UA: NONE SEEN WBC/HPF (ref <=5)
YEAST: NONE SEEN [HPF]

## 2015-12-06 LAB — VITAMIN D 25 HYDROXY (VIT D DEFICIENCY, FRACTURES): Vit D, 25-Hydroxy: 30 ng/mL (ref 30–100)

## 2016-06-12 ENCOUNTER — Other Ambulatory Visit: Payer: Self-pay

## 2016-06-12 DIAGNOSIS — Z79890 Hormone replacement therapy (postmenopausal): Secondary | ICD-10-CM

## 2016-06-12 MED ORDER — ESTRADIOL 0.5 MG PO TABS: 0.5000 mg | ORAL_TABLET | Freq: Every day | ORAL | 2 refills | Status: DC

## 2016-10-31 ENCOUNTER — Other Ambulatory Visit: Payer: Self-pay | Admitting: Women's Health

## 2016-10-31 DIAGNOSIS — Z1231 Encounter for screening mammogram for malignant neoplasm of breast: Secondary | ICD-10-CM

## 2016-12-08 ENCOUNTER — Encounter: Payer: 59 | Admitting: Women's Health

## 2016-12-08 ENCOUNTER — Ambulatory Visit: Payer: 59

## 2017-01-06 ENCOUNTER — Encounter: Payer: Self-pay | Admitting: Women's Health

## 2017-01-06 ENCOUNTER — Ambulatory Visit
Admission: RE | Admit: 2017-01-06 | Discharge: 2017-01-06 | Disposition: A | Discharge status: Home / Self Care | Payer: 59 | Source: Ambulatory Visit | Attending: Women's Health | Admitting: Women's Health

## 2017-01-06 ENCOUNTER — Ambulatory Visit (INDEPENDENT_AMBULATORY_CARE_PROVIDER_SITE_OTHER): Payer: 59 | Admitting: Women's Health

## 2017-01-06 VITALS — BP 120/82 | Ht 65.0 in | Wt 164.0 lb

## 2017-01-06 DIAGNOSIS — Z01419 Encounter for gynecological examination (general) (routine) without abnormal findings: Secondary | ICD-10-CM | POA: Diagnosis not present

## 2017-01-06 DIAGNOSIS — Z1231 Encounter for screening mammogram for malignant neoplasm of breast: Secondary | ICD-10-CM

## 2017-01-06 DIAGNOSIS — L900 Lichen sclerosus et atrophicus: Secondary | ICD-10-CM | POA: Diagnosis not present

## 2017-01-06 DIAGNOSIS — Z79890 Hormone replacement therapy (postmenopausal): Secondary | ICD-10-CM

## 2017-01-06 DIAGNOSIS — Z13220 Encounter for screening for lipoid disorders: Secondary | ICD-10-CM

## 2017-01-06 LAB — COMPREHENSIVE METABOLIC PANEL
ALK PHOS: 62 U/L (ref 33–130)
ALT: 7 U/L (ref 6–29)
AST: 12 U/L (ref 10–35)
Albumin: 4.1 g/dL (ref 3.6–5.1)
BUN: 14 mg/dL (ref 7–25)
CALCIUM: 9.2 mg/dL (ref 8.6–10.4)
CHLORIDE: 106 mmol/L (ref 98–110)
CO2: 24 mmol/L (ref 20–31)
Creat: 0.78 mg/dL (ref 0.50–1.05)
GLUCOSE: 90 mg/dL (ref 65–99)
Potassium: 3.8 mmol/L (ref 3.5–5.3)
Sodium: 140 mmol/L (ref 135–146)
Total Bilirubin: 0.3 mg/dL (ref 0.2–1.2)
Total Protein: 6.7 g/dL (ref 6.1–8.1)

## 2017-01-06 LAB — CBC WITH DIFFERENTIAL/PLATELET
BASOS ABS: 0 {cells}/uL (ref 0–200)
BASOS PCT: 0 %
Eosinophils Absolute: 162 cells/uL (ref 15–500)
Eosinophils Relative: 3 %
HCT: 39.3 % (ref 35.0–45.0)
Hemoglobin: 13.2 g/dL (ref 11.7–15.5)
LYMPHS PCT: 30 %
Lymphs Abs: 1620 cells/uL (ref 850–3900)
MCH: 29.3 pg (ref 27.0–33.0)
MCHC: 33.6 g/dL (ref 32.0–36.0)
MCV: 87.3 fL (ref 80.0–100.0)
MONO ABS: 432 {cells}/uL (ref 200–950)
MONOS PCT: 8 %
MPV: 8.7 fL (ref 7.5–12.5)
NEUTROS PCT: 59 %
Neutro Abs: 3186 cells/uL (ref 1500–7800)
PLATELETS: 236 10*3/uL (ref 140–400)
RBC: 4.5 MIL/uL (ref 3.80–5.10)
RDW: 13.6 % (ref 11.0–15.0)
WBC: 5.4 10*3/uL (ref 3.8–10.8)

## 2017-01-06 LAB — LIPID PANEL
Cholesterol: 194 mg/dL (ref <200)
HDL: 50 mg/dL — AB (ref >50)
LDL Cholesterol: 115 mg/dL — ABNORMAL HIGH (ref <100)
Total CHOL/HDL Ratio: 3.9 Ratio (ref <5.0)
Triglycerides: 144 mg/dL (ref <150)
VLDL: 29 mg/dL (ref <30)

## 2017-01-06 MED ORDER — CLOBETASOL PROPIONATE 0.05 % EX CREA: 1.0000 "application " | TOPICAL_CREAM | Freq: Two times a day (BID) | CUTANEOUS | 1 refills | Status: DC

## 2017-01-06 MED ORDER — ESTRADIOL 0.5 MG PO TABS: 0.5000 mg | ORAL_TABLET | Freq: Every day | ORAL | 4 refills | Status: DC

## 2017-01-06 NOTE — Patient Instructions (Signed)
Health Maintenance for Postmenopausal Women Menopause is a normal process in which your reproductive ability comes to an end. This process happens gradually over a span of months to years, usually between the ages of 33 and 38. Menopause is complete when you have missed 12 consecutive menstrual periods. It is important to talk with your health care provider about some of the most common conditions that affect postmenopausal women, such as heart disease, cancer, and bone loss (osteoporosis). Adopting a healthy lifestyle and getting preventive care can help to promote your health and wellness. Those actions can also lower your chances of developing some of these common conditions. What should I know about menopause? During menopause, you may experience a number of symptoms, such as:  Moderate-to-severe hot flashes.  Night sweats.  Decrease in sex drive.  Mood swings.  Headaches.  Tiredness.  Irritability.  Memory problems.  Insomnia. Choosing to treat or not to treat menopausal changes is an individual decision that you make with your health care provider. What should I know about hormone replacement therapy and supplements? Hormone therapy products are effective for treating symptoms that are associated with menopause, such as hot flashes and night sweats. Hormone replacement carries certain risks, especially as you become older. If you are thinking about using estrogen or estrogen with progestin treatments, discuss the benefits and risks with your health care provider. What should I know about heart disease and stroke? Heart disease, heart attack, and stroke become more likely as you age. This may be due, in part, to the hormonal changes that your body experiences during menopause. These can affect how your body processes dietary fats, triglycerides, and cholesterol. Heart attack and stroke are both medical emergencies. There are many things that you can do to help prevent heart disease  and stroke:  Have your blood pressure checked at least every 1-2 years. High blood pressure causes heart disease and increases the risk of stroke.  If you are 48-61 years old, ask your health care provider if you should take aspirin to prevent a heart attack or a stroke.  Do not use any tobacco products, including cigarettes, chewing tobacco, or electronic cigarettes. If you need help quitting, ask your health care provider.  It is important to eat a healthy diet and maintain a healthy weight.  Be sure to include plenty of vegetables, fruits, low-fat dairy products, and lean protein.  Avoid eating foods that are high in solid fats, added sugars, or salt (sodium).  Get regular exercise. This is one of the most important things that you can do for your health.  Try to exercise for at least 150 minutes each week. The type of exercise that you do should increase your heart rate and make you sweat. This is known as moderate-intensity exercise.  Try to do strengthening exercises at least twice each week. Do these in addition to the moderate-intensity exercise.  Know your numbers.Ask your health care provider to check your cholesterol and your blood glucose. Continue to have your blood tested as directed by your health care provider. What should I know about cancer screening? There are several types of cancer. Take the following steps to reduce your risk and to catch any cancer development as early as possible. Breast Cancer  Practice breast self-awareness.  This means understanding how your breasts normally appear and feel.  It also means doing regular breast self-exams. Let your health care provider know about any changes, no matter how small.  If you are 40 or older,  have a clinician do a breast exam (clinical breast exam or CBE) every year. Depending on your age, family history, and medical history, it may be recommended that you also have a yearly breast X-ray (mammogram).  If you  have a family history of breast cancer, talk with your health care provider about genetic screening.  If you are at high risk for breast cancer, talk with your health care provider about having an MRI and a mammogram every year.  Breast cancer (BRCA) gene test is recommended for women who have family members with BRCA-related cancers. Results of the assessment will determine the need for genetic counseling and BRCA1 and for BRCA2 testing. BRCA-related cancers include these types:  Breast. This occurs in males or females.  Ovarian.  Tubal. This may also be called fallopian tube cancer.  Cancer of the abdominal or pelvic lining (peritoneal cancer).  Prostate.  Pancreatic. Cervical, Uterine, and Ovarian Cancer  Your health care provider may recommend that you be screened regularly for cancer of the pelvic organs. These include your ovaries, uterus, and vagina. This screening involves a pelvic exam, which includes checking for microscopic changes to the surface of your cervix (Pap test).  For women ages 21-65, health care providers may recommend a pelvic exam and a Pap test every three years. For women ages 23-65, they may recommend the Pap test and pelvic exam, combined with testing for human papilloma virus (HPV), every five years. Some types of HPV increase your risk of cervical cancer. Testing for HPV may also be done on women of any age who have unclear Pap test results.  Other health care providers may not recommend any screening for nonpregnant women who are considered low risk for pelvic cancer and have no symptoms. Ask your health care provider if a screening pelvic exam is right for you.  If you have had past treatment for cervical cancer or a condition that could lead to cancer, you need Pap tests and screening for cancer for at least 20 years after your treatment. If Pap tests have been discontinued for you, your risk factors (such as having a new sexual partner) need to be reassessed  to determine if you should start having screenings again. Some women have medical problems that increase the chance of getting cervical cancer. In these cases, your health care provider may recommend that you have screening and Pap tests more often.  If you have a family history of uterine cancer or ovarian cancer, talk with your health care provider about genetic screening.  If you have vaginal bleeding after reaching menopause, tell your health care provider.  There are currently no reliable tests available to screen for ovarian cancer. Lung Cancer  Lung cancer screening is recommended for adults 99-83 years old who are at high risk for lung cancer because of a history of smoking. A yearly low-dose CT scan of the lungs is recommended if you:  Currently smoke.  Have a history of at least 30 pack-years of smoking and you currently smoke or have quit within the past 15 years. A pack-year is smoking an average of one pack of cigarettes per day for one year. Yearly screening should:  Continue until it has been 15 years since you quit.  Stop if you develop a health problem that would prevent you from having lung cancer treatment. Colorectal Cancer  This type of cancer can be detected and can often be prevented.  Routine colorectal cancer screening usually begins at age 72 and continues  through age 75.  If you have risk factors for colon cancer, your health care provider may recommend that you be screened at an earlier age.  If you have a family history of colorectal cancer, talk with your health care provider about genetic screening.  Your health care provider may also recommend using home test kits to check for hidden blood in your stool.  A small camera at the end of a tube can be used to examine your colon directly (sigmoidoscopy or colonoscopy). This is done to check for the earliest forms of colorectal cancer.  Direct examination of the colon should be repeated every 5-10 years until  age 75. However, if early forms of precancerous polyps or small growths are found or if you have a family history or genetic risk for colorectal cancer, you may need to be screened more often. Skin Cancer  Check your skin from head to toe regularly.  Monitor any moles. Be sure to tell your health care provider:  About any new moles or changes in moles, especially if there is a change in a mole's shape or color.  If you have a mole that is larger than the size of a pencil eraser.  If any of your family members has a history of skin cancer, especially at a young age, talk with your health care provider about genetic screening.  Always use sunscreen. Apply sunscreen liberally and repeatedly throughout the day.  Whenever you are outside, protect yourself by wearing long sleeves, pants, a wide-brimmed hat, and sunglasses. What should I know about osteoporosis? Osteoporosis is a condition in which bone destruction happens more quickly than new bone creation. After menopause, you may be at an increased risk for osteoporosis. To help prevent osteoporosis or the bone fractures that can happen because of osteoporosis, the following is recommended:  If you are 19-50 years old, get at least 1,000 mg of calcium and at least 600 mg of vitamin D per day.  If you are older than age 50 but younger than age 70, get at least 1,200 mg of calcium and at least 600 mg of vitamin D per day.  If you are older than age 70, get at least 1,200 mg of calcium and at least 800 mg of vitamin D per day. Smoking and excessive alcohol intake increase the risk of osteoporosis. Eat foods that are rich in calcium and vitamin D, and do weight-bearing exercises several times each week as directed by your health care provider. What should I know about how menopause affects my mental health? Depression may occur at any age, but it is more common as you become older. Common symptoms of depression include:  Low or sad  mood.  Changes in sleep patterns.  Changes in appetite or eating patterns.  Feeling an overall lack of motivation or enjoyment of activities that you previously enjoyed.  Frequent crying spells. Talk with your health care provider if you think that you are experiencing depression. What should I know about immunizations? It is important that you get and maintain your immunizations. These include:  Tetanus, diphtheria, and pertussis (Tdap) booster vaccine.  Influenza every year before the flu season begins.  Pneumonia vaccine.  Shingles vaccine. Your health care provider may also recommend other immunizations. This information is not intended to replace advice given to you by your health care provider. Make sure you discuss any questions you have with your health care provider. Document Released: 10/03/2005 Document Revised: 02/29/2016 Document Reviewed: 05/15/2015 Elsevier Interactive Patient   Education  2017 [employer]  

## 2017-01-06 NOTE — Progress Notes (Signed)
Connie Alvarez 07/31/1961 [id number]    History:    Presents for annual exam. 2002 TAH BSO for endometriosis  On estradiol .5mg  daily. Normal pap and mammogram history. 2016 normal dexa. 2016 neg colonoscopy. Lichen sclerosis, minimal problems past year.  Past medical history, past surgical history, family history and social history were all reviewed and documented in the EPIC chart. 8 siblings, 1 with breast cancer. 2 daughters, one in [place], married, 1 in [place] gay. Mother DM and HTN.  From [place].  ROS:  A ROS was performed and pertinent positives and negatives are included.  Exam:  Vitals:   01/06/17 1150  BP: 120/82  Weight: 164 lb (74.4 kg)  Height: 5\' 5"  (1.651 m)   Body mass index is 27.29 kg/m.   General appearance:  Normal Thyroid:  Symmetrical, normal in size, without palpable masses or nodularity. Respiratory  Auscultation:  Clear without wheezing or rhonchi Cardiovascular  Auscultation:  Regular rate, without rubs, murmurs or gallops  Edema/varicosities:  Not grossly evident Abdominal  Soft,nontender, without masses, guarding or rebound.  Liver/spleen:  No organomegaly noted  Hernia:  None appreciated  Skin  Inspection:  Grossly normal   Breasts: Examined lying and sitting.     Right: Without masses, retractions, discharge or axillary adenopathy.     Left: Without masses, retractions, discharge or axillary adenopathy. Gentitourinary   Inguinal/mons:  Normal without inguinal adenopathy  External genitalia:  Lichen sclerosis, post fourchette.  BUS/Urethra/Skene's glands:  Normal  Vagina:  Normal  Cervix:  absent  Uterus:  absent  Adnexa/parametria:     Rt: Without masses or tenderness.   Lt: Without masses or tenderness.  Anus and perineum: Normal  Digital rectal exam: Normal sphincter tone without palpated masses or tenderness  Assessment/Plan:  55 y.o.MWF G2P2  for annual exam with no complaints.  2002 TAH with BSO for endometriosis on  estradiol Hx of lichen sclerosis  Plan HRT reviewed risks of blood clots, strokes, breast cancer. Estradiol .5 mg daily, reviewed could try 1/2 tablet daily and then stop. Refill of temovate given to use sparingly prn. SBE's, continue annual screens, exercise, calcium rich diet, vit D 2000 iu daily.  CBC, CMET, lipid panel.         Huel Cote [facility], 12:32 PM 01/06/2017

## 2017-01-07 LAB — URINALYSIS W MICROSCOPIC + REFLEX CULTURE
BACTERIA UA: NONE SEEN [HPF]
Bilirubin Urine: NEGATIVE
CASTS: NONE SEEN [LPF]
Crystals: NONE SEEN [HPF]
Glucose, UA: NEGATIVE
HGB URINE DIPSTICK: NEGATIVE
Ketones, ur: NEGATIVE
LEUKOCYTES UA: NEGATIVE
NITRITE: NEGATIVE
PROTEIN: NEGATIVE
RBC / HPF: NONE SEEN RBC/HPF (ref <=2)
Specific Gravity, Urine: 1.011 (ref 1.001–1.035)
WBC, UA: NONE SEEN WBC/HPF (ref <=5)
Yeast: NONE SEEN [HPF]
pH: 6 (ref 5.0–8.0)

## 2017-01-08 ENCOUNTER — Encounter: Payer: Self-pay | Admitting: Women's Health

## 2018-01-01 ENCOUNTER — Other Ambulatory Visit: Payer: Self-pay | Admitting: Women's Health

## 2018-01-01 DIAGNOSIS — Z79890 Hormone replacement therapy (postmenopausal): Secondary | ICD-10-CM

## 2018-01-11 ENCOUNTER — Other Ambulatory Visit: Payer: Self-pay | Admitting: Women's Health

## 2018-01-11 ENCOUNTER — Encounter: Payer: 59 | Admitting: Women's Health

## 2018-01-11 DIAGNOSIS — Z1231 Encounter for screening mammogram for malignant neoplasm of breast: Secondary | ICD-10-CM

## 2018-01-11 DIAGNOSIS — Z0289 Encounter for other administrative examinations: Secondary | ICD-10-CM

## 2018-03-08 ENCOUNTER — Ambulatory Visit: Payer: 59

## 2018-03-08 ENCOUNTER — Encounter: Payer: Self-pay | Admitting: Women's Health

## 2018-03-09 ENCOUNTER — Ambulatory Visit
Admission: RE | Admit: 2018-03-09 | Discharge: 2018-03-09 | Disposition: A | Discharge status: Home / Self Care | Payer: BLUE CROSS/BLUE SHIELD | Source: Ambulatory Visit | Attending: Women's Health | Admitting: Women's Health

## 2018-03-09 ENCOUNTER — Ambulatory Visit (INDEPENDENT_AMBULATORY_CARE_PROVIDER_SITE_OTHER): Payer: BLUE CROSS/BLUE SHIELD | Admitting: Women's Health

## 2018-03-09 ENCOUNTER — Encounter: Payer: Self-pay | Admitting: Women's Health

## 2018-03-09 VITALS — BP 124/78 | Ht 65.25 in | Wt 168.0 lb

## 2018-03-09 DIAGNOSIS — Z13220 Encounter for screening for lipoid disorders: Secondary | ICD-10-CM | POA: Diagnosis not present

## 2018-03-09 DIAGNOSIS — Z01419 Encounter for gynecological examination (general) (routine) without abnormal findings: Secondary | ICD-10-CM | POA: Diagnosis not present

## 2018-03-09 DIAGNOSIS — Z1231 Encounter for screening mammogram for malignant neoplasm of breast: Secondary | ICD-10-CM

## 2018-03-09 LAB — COMPREHENSIVE METABOLIC PANEL
AG Ratio: 1.8 (calc) (ref 1.0–2.5)
ALBUMIN MSPROF: 4.2 g/dL (ref 3.6–5.1)
ALKALINE PHOSPHATASE (APISO): 70 U/L (ref 33–130)
ALT: 9 U/L (ref 6–29)
AST: 13 U/L (ref 10–35)
BILIRUBIN TOTAL: 0.3 mg/dL (ref 0.2–1.2)
BUN: 18 mg/dL (ref 7–25)
CALCIUM: 9.6 mg/dL (ref 8.6–10.4)
CHLORIDE: 106 mmol/L (ref 98–110)
CO2: 25 mmol/L (ref 20–32)
Creat: 0.82 mg/dL (ref 0.50–1.05)
GLOBULIN: 2.4 g/dL (ref 1.9–3.7)
Glucose, Bld: 93 mg/dL (ref 65–99)
Potassium: 4.1 mmol/L (ref 3.5–5.3)
Sodium: 140 mmol/L (ref 135–146)
Total Protein: 6.6 g/dL (ref 6.1–8.1)

## 2018-03-09 LAB — CBC WITH DIFFERENTIAL/PLATELET
BASOS PCT: 0.6 %
Basophils Absolute: 29 cells/uL (ref 0–200)
EOS ABS: 158 {cells}/uL (ref 15–500)
Eosinophils Relative: 3.3 %
HCT: 37.4 % (ref 35.0–45.0)
Hemoglobin: 12.8 g/dL (ref 11.7–15.5)
Lymphs Abs: 1502 cells/uL (ref 850–3900)
MCH: 28.9 pg (ref 27.0–33.0)
MCHC: 34.2 g/dL (ref 32.0–36.0)
MCV: 84.4 fL (ref 80.0–100.0)
MPV: 9.8 fL (ref 7.5–12.5)
Monocytes Relative: 8.5 %
Neutro Abs: 2702 cells/uL (ref 1500–7800)
Neutrophils Relative %: 56.3 %
Platelets: 254 10*3/uL (ref 140–400)
RBC: 4.43 10*6/uL (ref 3.80–5.10)
RDW: 12.9 % (ref 11.0–15.0)
Total Lymphocyte: 31.3 %
WBC: 4.8 10*3/uL (ref 3.8–10.8)
WBCMIX: 408 {cells}/uL (ref 200–950)

## 2018-03-09 LAB — LIPID PANEL
CHOLESTEROL: 205 mg/dL — AB (ref <200)
HDL: 48 mg/dL — ABNORMAL LOW (ref >50)
LDL Cholesterol (Calc): 127 mg/dL (calc) — ABNORMAL HIGH
Non-HDL Cholesterol (Calc): 157 mg/dL (calc) — ABNORMAL HIGH (ref <130)
Total CHOL/HDL Ratio: 4.3 (calc) (ref <5.0)
Triglycerides: 188 mg/dL — ABNORMAL HIGH (ref <150)

## 2018-03-09 NOTE — Progress Notes (Signed)
Connie Alvarez 1961-07-08 [id number]    History:    Presents for annual exam.  2002 TAH with BSO for endometriosis stopped estradiol last year.  2016 normal DEXA.  Normal Pap and mammogram history.  2011 benign colon polyp 5-year follow-up 2016 no polyps.  History of lichen sclerosis asymptomatic.  Past medical history, past surgical history, family history and social history were all reviewed and documented in the EPIC chart.  Works for [employer].  Originally from [place].  Has 8 siblings.  Mother hypertension and diabetes, both parents are in their 12s and lives independently.  2 daughters married, one lives in [place]  other daughter lives in [place] who is gay.  ROS:  A ROS was performed and pertinent positives and negatives are included.  Exam:  Vitals:   03/09/18 1120  BP: 124/78  Weight: 168 lb (76.2 kg)  Height: 5' 5.25" (1.657 m)   Body mass index is 27.74 kg/m.   General appearance:  Normal Thyroid:  Symmetrical, normal in size, without palpable masses or nodularity. Respiratory  Auscultation:  Clear without wheezing or rhonchi Cardiovascular  Auscultation:  Regular rate, without rubs, murmurs or gallops  Edema/varicosities:  Not grossly evident Abdominal  Soft,nontender, without masses, guarding or rebound.  Liver/spleen:  No organomegaly noted  Hernia:  None appreciated  Skin  Inspection:  Grossly normal   Breasts: Examined lying and sitting.     Right: Without masses, retractions, discharge or axillary adenopathy.     Left: Without masses, retractions, discharge or axillary adenopathy. Gentitourinary   Inguinal/mons:  Normal without inguinal adenopathy  External genitalia:  Normal  BUS/Urethra/Skene's glands:  Normal  Vagina:  Normal  Cervix: And uterus absent  Adnexa/parametria:     Rt: Without masses or tenderness.   Lt: Without masses or tenderness.  Anus and perineum: Normal  Digital rectal exam: Normal sphincter tone without palpated masses or  tenderness  Assessment/Plan:  57 y.o. MWF G2, P2 for annual exam with no complaints.  2002 TAH with BSO for endometriosis on no HRT Overweight  Plan: States tolerating being off estrogen without problem.  SBE's, continue annual screening mammogram had mammogram today.  Reviewed importance of weightbearing balance type exercise, yoga encouraged.  Home safety, fall prevention discussed.  Encouragedlower calorie/carb diet.  Shingrex vaccine recommended at age 44.  CBC, CMP, lipid panel,    Huel Cote [facility], 12:11 PM 03/09/2018

## 2018-03-09 NOTE — Patient Instructions (Signed)
Health Maintenance for Postmenopausal Women Menopause is a normal process in which your reproductive ability comes to an end. This process happens gradually over a span of months to years, usually between the ages of 22 and 9. Menopause is complete when you have missed 12 consecutive menstrual periods. It is important to talk with your health care provider about some of the most common conditions that affect postmenopausal women, such as heart disease, cancer, and bone loss (osteoporosis). Adopting a healthy lifestyle and getting preventive care can help to promote your health and wellness. Those actions can also lower your chances of developing some of these common conditions. What should I know about menopause? During menopause, you may experience a number of symptoms, such as:  Moderate-to-severe hot flashes.  Night sweats.  Decrease in sex drive.  Mood swings.  Headaches.  Tiredness.  Irritability.  Memory problems.  Insomnia.  Choosing to treat or not to treat menopausal changes is an individual decision that you make with your health care provider. What should I know about hormone replacement therapy and supplements? Hormone therapy products are effective for treating symptoms that are associated with menopause, such as hot flashes and night sweats. Hormone replacement carries certain risks, especially as you become older. If you are thinking about using estrogen or estrogen with progestin treatments, discuss the benefits and risks with your health care provider. What should I know about heart disease and stroke? Heart disease, heart attack, and stroke become more likely as you age. This may be due, in part, to the hormonal changes that your body experiences during menopause. These can affect how your body processes dietary fats, triglycerides, and cholesterol. Heart attack and stroke are both medical emergencies. There are many things that you can do to help prevent heart disease  and stroke:  Have your blood pressure checked at least every 1-2 years. High blood pressure causes heart disease and increases the risk of stroke.  If you are 53-22 years old, ask your health care provider if you should take aspirin to prevent a heart attack or a stroke.  Do not use any tobacco products, including cigarettes, chewing tobacco, or electronic cigarettes. If you need help quitting, ask your health care provider.  It is important to eat a healthy diet and maintain a healthy weight. ? Be sure to include plenty of vegetables, fruits, low-fat dairy products, and lean protein. ? Avoid eating foods that are high in solid fats, added sugars, or salt (sodium).  Get regular exercise. This is one of the most important things that you can do for your health. ? Try to exercise for at least 150 minutes each week. The type of exercise that you do should increase your heart rate and make you sweat. This is known as moderate-intensity exercise. ? Try to do strengthening exercises at least twice each week. Do these in addition to the moderate-intensity exercise.  Know your numbers.Ask your health care provider to check your cholesterol and your blood glucose. Continue to have your blood tested as directed by your health care provider.  What should I know about cancer screening? There are several types of cancer. Take the following steps to reduce your risk and to catch any cancer development as early as possible. Breast Cancer  Practice breast self-awareness. ? This means understanding how your breasts normally appear and feel. ? It also means doing regular breast self-exams. Let your health care provider know about any changes, no matter how small.  If you are 40  or older, have a clinician do a breast exam (clinical breast exam or CBE) every year. Depending on your age, family history, and medical history, it may be recommended that you also have a yearly breast X-ray (mammogram).  If you  have a family history of breast cancer, talk with your health care provider about genetic screening.  If you are at high risk for breast cancer, talk with your health care provider about having an MRI and a mammogram every year.  Breast cancer (BRCA) gene test is recommended for women who have family members with BRCA-related cancers. Results of the assessment will determine the need for genetic counseling and BRCA1 and for BRCA2 testing. BRCA-related cancers include these types: ? Breast. This occurs in males or females. ? Ovarian. ? Tubal. This may also be called fallopian tube cancer. ? Cancer of the abdominal or pelvic lining (peritoneal cancer). ? Prostate. ? Pancreatic.  Cervical, Uterine, and Ovarian Cancer Your health care provider may recommend that you be screened regularly for cancer of the pelvic organs. These include your ovaries, uterus, and vagina. This screening involves a pelvic exam, which includes checking for microscopic changes to the surface of your cervix (Pap test).  For women ages 21-65, health care providers may recommend a pelvic exam and a Pap test every three years. For women ages 79-65, they may recommend the Pap test and pelvic exam, combined with testing for human papilloma virus (HPV), every five years. Some types of HPV increase your risk of cervical cancer. Testing for HPV may also be done on women of any age who have unclear Pap test results.  Other health care providers may not recommend any screening for nonpregnant women who are considered low risk for pelvic cancer and have no symptoms. Ask your health care provider if a screening pelvic exam is right for you.  If you have had past treatment for cervical cancer or a condition that could lead to cancer, you need Pap tests and screening for cancer for at least 20 years after your treatment. If Pap tests have been discontinued for you, your risk factors (such as having a new sexual partner) need to be  reassessed to determine if you should start having screenings again. Some women have medical problems that increase the chance of getting cervical cancer. In these cases, your health care provider may recommend that you have screening and Pap tests more often.  If you have a family history of uterine cancer or ovarian cancer, talk with your health care provider about genetic screening.  If you have vaginal bleeding after reaching menopause, tell your health care provider.  There are currently no reliable tests available to screen for ovarian cancer.  Lung Cancer Lung cancer screening is recommended for adults 69-62 years old who are at high risk for lung cancer because of a history of smoking. A yearly low-dose CT scan of the lungs is recommended if you:  Currently smoke.  Have a history of at least 30 pack-years of smoking and you currently smoke or have quit within the past 15 years. A pack-year is smoking an average of one pack of cigarettes per day for one year.  Yearly screening should:  Continue until it has been 15 years since you quit.  Stop if you develop a health problem that would prevent you from having lung cancer treatment.  Colorectal Cancer  This type of cancer can be detected and can often be prevented.  Routine colorectal cancer screening usually begins at  age 42 and continues through age 45.  If you have risk factors for colon cancer, your health care provider may recommend that you be screened at an earlier age.  If you have a family history of colorectal cancer, talk with your health care provider about genetic screening.  Your health care provider may also recommend using home test kits to check for hidden blood in your stool.  A small camera at the end of a tube can be used to examine your colon directly (sigmoidoscopy or colonoscopy). This is done to check for the earliest forms of colorectal cancer.  Direct examination of the colon should be repeated every  5-10 years until age 71. However, if early forms of precancerous polyps or small growths are found or if you have a family history or genetic risk for colorectal cancer, you may need to be screened more often.  Skin Cancer  Check your skin from head to toe regularly.  Monitor any moles. Be sure to tell your health care provider: ? About any new moles or changes in moles, especially if there is a change in a mole's shape or color. ? If you have a mole that is larger than the size of a pencil eraser.  If any of your family members has a history of skin cancer, especially at a Kaleeya Hancock age, talk with your health care provider about genetic screening.  Always use sunscreen. Apply sunscreen liberally and repeatedly throughout the day.  Whenever you are outside, protect yourself by wearing long sleeves, pants, a wide-brimmed hat, and sunglasses.  What should I know about osteoporosis? Osteoporosis is a condition in which bone destruction happens more quickly than new bone creation. After menopause, you may be at an increased risk for osteoporosis. To help prevent osteoporosis or the bone fractures that can happen because of osteoporosis, the following is recommended:  If you are 46-71 years old, get at least 1,000 mg of calcium and at least 600 mg of vitamin D per day.  If you are older than age 55 but younger than age 65, get at least 1,200 mg of calcium and at least 600 mg of vitamin D per day.  If you are older than age 54, get at least 1,200 mg of calcium and at least 800 mg of vitamin D per day.  Smoking and excessive alcohol intake increase the risk of osteoporosis. Eat foods that are rich in calcium and vitamin D, and do weight-bearing exercises several times each week as directed by your health care provider. What should I know about how menopause affects my mental health? Depression may occur at any age, but it is more common as you become older. Common symptoms of depression  include:  Low or sad mood.  Changes in sleep patterns.  Changes in appetite or eating patterns.  Feeling an overall lack of motivation or enjoyment of activities that you previously enjoyed.  Frequent crying spells.  Talk with your health care provider if you think that you are experiencing depression. What should I know about immunizations? It is important that you get and maintain your immunizations. These include:  Tetanus, diphtheria, and pertussis (Tdap) booster vaccine.  Influenza every year before the flu season begins.  Pneumonia vaccine.  Shingles vaccine.  Your health care provider may also recommend other immunizations. This information is not intended to replace advice given to you by your health care provider. Make sure you discuss any questions you have with your health care provider. Document Released: 10/03/2005  Document Revised: 02/29/2016 Document Reviewed: 05/15/2015 [employer] Interactive Patient Education  2018 [employer]  

## 2018-03-10 ENCOUNTER — Encounter (INDEPENDENT_AMBULATORY_CARE_PROVIDER_SITE_OTHER): Payer: Self-pay

## 2018-04-29 DIAGNOSIS — D1801 Hemangioma of skin and subcutaneous tissue: Secondary | ICD-10-CM | POA: Diagnosis not present

## 2018-04-29 DIAGNOSIS — L918 Other hypertrophic disorders of the skin: Secondary | ICD-10-CM | POA: Diagnosis not present

## 2018-04-29 DIAGNOSIS — D229 Melanocytic nevi, unspecified: Secondary | ICD-10-CM | POA: Diagnosis not present

## 2018-06-04 DIAGNOSIS — Z23 Encounter for immunization: Secondary | ICD-10-CM | POA: Diagnosis not present

## 2019-03-31 ENCOUNTER — Other Ambulatory Visit: Payer: Self-pay | Admitting: Women's Health

## 2019-03-31 DIAGNOSIS — Z1231 Encounter for screening mammogram for malignant neoplasm of breast: Secondary | ICD-10-CM

## 2019-04-05 ENCOUNTER — Other Ambulatory Visit: Payer: Self-pay

## 2019-04-06 ENCOUNTER — Encounter: Payer: Self-pay | Admitting: Women's Health

## 2019-04-06 ENCOUNTER — Ambulatory Visit (INDEPENDENT_AMBULATORY_CARE_PROVIDER_SITE_OTHER): Payer: BC Managed Care – PPO | Admitting: Women's Health

## 2019-04-06 ENCOUNTER — Other Ambulatory Visit: Payer: Self-pay

## 2019-04-06 VITALS — BP 126/80 | Ht 65.0 in | Wt 165.0 lb

## 2019-04-06 DIAGNOSIS — Z01419 Encounter for gynecological examination (general) (routine) without abnormal findings: Secondary | ICD-10-CM | POA: Diagnosis not present

## 2019-04-06 DIAGNOSIS — Z13220 Encounter for screening for lipoid disorders: Secondary | ICD-10-CM | POA: Diagnosis not present

## 2019-04-06 MED ORDER — ESTRING 2 MG VA RING: 2.0000 mg | VAGINAL_RING | VAGINAL | 4 refills | Status: DC

## 2019-04-06 NOTE — Patient Instructions (Addendum)
shingrex vaccine  Shingles  Health Maintenance for Postmenopausal Women Menopause is a normal process in which your ability to get pregnant comes to an end. This process happens slowly over many months or years, usually between the ages of 51 and 82. Menopause is complete when you have missed your menstrual periods for 12 months. It is important to talk with your health care provider about some of the most common conditions that affect women after menopause (postmenopausal women). These include heart disease, cancer, and bone loss (osteoporosis). Adopting a healthy lifestyle and getting preventive care can help to promote your health and wellness. The actions you take can also lower your chances of developing some of these common conditions. What should I know about menopause? During menopause, you may get a number of symptoms, such as:  Hot flashes. These can be moderate or severe.  Night sweats.  Decrease in sex drive.  Mood swings.  Headaches.  Tiredness.  Irritability. Memory  Estradiol vaginal ring (Estring) What is this medicine? ESTRADIOL (es tra DYE ole) vaginal ring is an insert that contains a female hormone. This medicine helps relieve symptoms of vaginal irritation and dryness that occurs in some women during menopause. This medicine may be used for other purposes; ask your health care provider or pharmacist if you have questions. COMMON BRAND NAME(S): Estring What should I tell my health care provider before I take this medicine? They need to know if you have any of these conditions: abnormal vaginal bleeding blood vessel disease or blood clots breast, cervical, endometrial, ovarian, liver, or uterine cancer dementia diabetes gallbladder disease heart disease or recent heart attack high blood pressure high cholesterol high level of calcium in the blood hysterectomy kidney disease liver disease migraine headaches protein C deficiency protein S deficiency  stroke systemic lupus erythematosus (SLE) tobacco smoker an unusual or allergic reaction to estrogens, other hormones, medicines, foods, dyes, or preservatives pregnant or trying to get pregnant breast-feeding How should I use this medicine? This medicine may be inserted by you or your physician. Follow the directions that are included with your prescription. If you are unsure how to insert the ring, contact your doctor or health care professional. The vaginal ring should remain in place for 90 days. After 90 days you should replace your old ring and insert a new one. Do not stop using except on the advice of your doctor or health care professional. Contact your pediatrician regarding the use of this medicine in children. Special care may be needed. A patient package insert for the product will be given with each prescription and refill. Read this sheet carefully each time. The sheet may change frequently. Overdosage: If you think you have taken too much of this medicine contact a poison control center or emergency room at once. NOTE: This medicine is only for you. Do not share this medicine with others. What if I miss a dose? If you miss a dose, use it as soon as you can. If it is almost time for your next dose, use only that dose. Do not use double or extra doses. What may interact with this medicine? Do not take this medicine with any of the following medications: aromatase inhibitors like aminoglutethimide, anastrozole, exemestane, letrozole, testolactone, vorozole This medicine may also interact with the following medications: carbamazepine certain antibiotics used to treat infections certain barbiturates used for inducing sleep or treating seizures grapefruit juice medicines for fungus infections like itraconazole and ketoconazole raloxifene or tamoxifen rifabutin, rifampin, or rifapentine ritonavir [facility].  John's Wort This list may not describe all possible interactions. Give your  health care provider a list of all the medicines, herbs, non-prescription drugs, or dietary supplements you use. Also tell them if you smoke, drink alcohol, or use illegal drugs. Some items may interact with your medicine. What should I watch for while using this medicine? Visit your doctor or health care professional for regular checks on your progress. You will need a regular breast and pelvic exam and Pap smear while on this medicine. You should also discuss the need for regular mammograms with your health care professional, and follow his or her guidelines for these tests. This medicine can make your body retain fluid, making your fingers, hands, or ankles swell. Your blood pressure can go up. Contact your doctor or health care professional if you feel you are retaining fluid. If you have any reason to think you are pregnant, stop taking this medicine right away and contact your doctor or health care professional. Smoking increases the risk of getting a blood clot or having a stroke while you are taking this medicine, especially if you are more than 60 years old. You are strongly advised not to smoke. If you wear contact lenses and notice visual changes, or if the lenses begin to feel uncomfortable, consult your eye doctor or health care professional. This medicine can increase the risk of developing a condition (endometrial hyperplasia) that may lead to cancer of the lining of the uterus. Taking progestins, another hormone drug, with this medicine lowers the risk of developing this condition. Therefore, if your uterus has not been removed (by a hysterectomy), your doctor may prescribe a progestin for you to take together with your estrogen. You should know, however, that taking estrogens with progestins may have additional health risks. You should discuss the use of estrogens and progestins with your health care professional to determine the benefits and risks for you. If you are going to have surgery,  you may need to stop taking this medicine. Consult your health care professional for advice before you schedule the surgery. You may bathe or participate in other activities while using this medicine. You do not need to remove the vaginal ring during sexual or other activities unless you are more comfortable doing so. Within the 90-day dosage period, you may remove the vaginal ring, rinse it with clean lukewarm (not hot or boiling) water, and re-insert the ring as needed. What side effects may I notice from receiving this medicine? Side effects that you should report to your doctor or health care professional as soon as possible: allergic reactions like skin rash, itching or hives, swelling of the face, lips, or tongue breast tissue changes or discharge signs and symptoms of a blood clot such as breathing problems; changes in vision; chest pain; severe, sudden headache; pain, swelling, warmth in the leg; trouble speaking; sudden numbness or weakness of the face, arm or leg signs and symptoms of infection like fever or chills; vomiting; diarrhea; muscle pain; dizziness; or a red, sunburn-like rash on face and body signs and symptoms of liver injury like dark yellow or brown urine; general ill feeling or flu-like symptoms; light-colored stools; loss of appetite; nausea; right upper belly pain; unusually weak or tired; yellowing of the eyes or skin symptoms of bowel blockage like constipation, abdominal swelling, abdominal pain, inability to pass gas or have a bowel movement symptoms of vaginal infection like itching, irritation or unusual discharge unusual or increased vaginal bleeding vaginal pain or soreness, redness,  swelling Side effects that usually do not require medical attention (report to your doctor or health care professional if they continue or are bothersome): breast tenderness fluid retention hair loss headache nausea upset stomach vaginal spotting This list may not describe all  possible side effects. Call your doctor for medical advice about side effects. You may report side effects to FDA at 1-800-FDA-1088. Where should I keep my medicine? Keep out of the reach of children. Store at room temperature between 15 and 25 degrees C (59 and 77 degrees F). Throw away any unused medicine after the expiration date. NOTE: This sheet is a summary. It may not cover all possible information. If you have questions about this medicine, talk to your doctor, pharmacist, or health care provider.  2020 Elsevier/Gold Standard (2014-06-05 13:20:25)  problems.  Insomnia. Choosing to treat or not to treat these symptoms is a decision that you make with your health care provider. Do I need hormone replacement therapy?  Hormone replacement therapy is effective in treating symptoms that are caused by menopause, such as hot flashes and night sweats.  Hormone replacement carries certain risks, especially as you become older. If you are thinking about using estrogen or estrogen with progestin, discuss the benefits and risks with your health care provider. What is my risk for heart disease and stroke? The risk of heart disease, heart attack, and stroke increases as you age. One of the causes may be a change in the body's hormones during menopause. This can affect how your body uses dietary fats, triglycerides, and cholesterol. Heart attack and stroke are medical emergencies. There are many things that you can do to help prevent heart disease and stroke. Watch your blood pressure  High blood pressure causes heart disease and increases the risk of stroke. This is more likely to develop in people who have high blood pressure readings, are of African descent, or are overweight.  Have your blood pressure checked: ? Every 3-5 years if you are 82-80 years of age. ? Every year if you are 34 years old or older. Eat a healthy diet   Eat a diet that includes plenty of vegetables, fruits, low-fat  dairy products, and lean protein.  Do not eat a lot of foods that are high in solid fats, added sugars, or sodium. Get regular exercise Get regular exercise. This is one of the most important things you can do for your health. Most adults should:  Try to exercise for at least 150 minutes each week. The exercise should increase your heart rate and make you sweat (moderate-intensity exercise).  Try to do strengthening exercises at least twice each week. Do these in addition to the moderate-intensity exercise.  Spend less time sitting. Even light physical activity can be beneficial. Other tips  Work with your health care provider to achieve or maintain a healthy weight.  Do not use any products that contain nicotine or tobacco, such as cigarettes, e-cigarettes, and chewing tobacco. If you need help quitting, ask your health care provider.  Know your numbers. Ask your health care provider to check your cholesterol and your blood sugar (glucose). Continue to have your blood tested as directed by your health care provider. Do I need screening for cancer? Depending on your health history and family history, you may need to have cancer screening at different stages of your life. This may include screening for:  Breast cancer.  Cervical cancer.  Lung cancer.  Colorectal cancer. What is my risk for osteoporosis? After menopause,  you may be at increased risk for osteoporosis. Osteoporosis is a condition in which bone destruction happens more quickly than new bone creation. To help prevent osteoporosis or the bone fractures that can happen because of osteoporosis, you may take the following actions:  If you are 64-4 years old, get at least 1,000 mg of calcium and at least 600 mg of vitamin D per day.  If you are older than age 36 but younger than age 2, get at least 1,200 mg of calcium and at least 600 mg of vitamin D per day.  If you are older than age 29, get at least 1,200 mg of calcium  and at least 800 mg of vitamin D per day. Smoking and drinking excessive alcohol increase the risk of osteoporosis. Eat foods that are rich in calcium and vitamin D, and do weight-bearing exercises several times each week as directed by your health care provider. How does menopause affect my mental health? Depression may occur at any age, but it is more common as you become older. Common symptoms of depression include:  Low or sad mood.  Changes in sleep patterns.  Changes in appetite or eating patterns.  Feeling an overall lack of motivation or enjoyment of activities that you previously enjoyed.  Frequent crying spells. Talk with your health care provider if you think that you are experiencing depression. General instructions See your health care provider for regular wellness exams and vaccines. This may include:  Scheduling regular health, dental, and eye exams.  Getting and maintaining your vaccines. These include: ? Influenza vaccine. Get this vaccine each year before the flu season begins. ? Pneumonia vaccine. ? Shingles vaccine. ? Tetanus, diphtheria, and pertussis (Tdap) booster vaccine. Your health care provider may also recommend other immunizations. Tell your health care provider if you have ever been abused or do not feel safe at home. Summary  Menopause is a normal process in which your ability to get pregnant comes to an end.  This condition causes hot flashes, night sweats, decreased interest in sex, mood swings, headaches, or lack of sleep.  Treatment for this condition may include hormone replacement therapy.  Take actions to keep yourself healthy, including exercising regularly, eating a healthy diet, watching your weight, and checking your blood pressure and blood sugar levels.  Get screened for cancer and depression. Make sure that you are up to date with all your vaccines. This information is not intended to replace advice given to you by your health care  provider. Make sure you discuss any questions you have with your health care provider. Document Released: 10/03/2005 Document Revised: 08/04/2018 Document Reviewed: 08/04/2018 Elsevier Patient Education  2020 [employer].

## 2019-04-06 NOTE — Progress Notes (Signed)
Connie Alvarez [birth year] [id number]    History:    Presents for annual exam.  2002 TAH with BSO for endometriosis with no HRT for endometriosis.  Normal Pap and mammogram history.  2016- colonoscopy.  2016 normal DEXA.    Past medical history, past surgical history, family history and social history were all reviewed and documented in the EPIC chart.  Web designer.  2 daughters 1 lives in [place] second daughter lives in [place] pregnant due in February.  8 siblings all healthy, parents in their 52s live in [place], mother diabetes and hypertension.    ROS:  A ROS was performed and pertinent positives and negatives are included.  Exam:  Vitals:   04/06/19 0828  BP: 126/80  Weight: 165 lb (74.8 kg)  Height: 5\' 5"  (1.651 m)   Body mass index is 27.46 kg/m.   General appearance:  Normal Thyroid:  Symmetrical, normal in size, without palpable masses or nodularity. Respiratory  Auscultation:  Clear without wheezing or rhonchi Cardiovascular  Auscultation:  Regular rate, without rubs, murmurs or gallops  Edema/varicosities:  Not grossly evident Abdominal  Soft,nontender, without masses, guarding or rebound.  Liver/spleen:  No organomegaly noted  Hernia:  None appreciated  Skin  Inspection:  Grossly normal   Breasts: Examined lying and sitting.     Right: Without masses, retractions, discharge or axillary adenopathy.     Left: Without masses, retractions, discharge or axillary adenopathy. Gentitourinary   Inguinal/mons:  Normal without inguinal adenopathy  External genitalia:  Normal  BUS/Urethra/Skene's glands:  Normal  Vagina:  Normal  Cervix: And uterus absent   Adnexa/parametria:     Rt: Without masses or tenderness.   Lt: Without masses or tenderness.  Anus and perineum: Normal  Digital rectal exam: Normal sphincter tone without palpated masses or tenderness  Assessment/Plan:  58 y.o. MWF G2, P2 for annual exam with occasional dyspareunia.   .  2002 TAH with BSO on no HRT Vaginal atrophy  Plan: Options reviewed we will try Estring 2 mg Place 1 vaginal ring and vagina leave for 3 months, prescription, coupon given instructed to call if continued discomfort with intercourse.  SBEs, continue annual screening mammogram, had today.  Exercise, calcium rich foods, vitamin D 2000 daily encouraged.  Importance of weightbearing and balance type exercise reviewed.  Shingrex reviewed and encouraged.  Will return to office fasting for CBC, CMP, lipid panel.    [place], 9:14 AM 04/06/2019

## 2019-04-08 ENCOUNTER — Other Ambulatory Visit: Payer: BC Managed Care – PPO

## 2019-04-08 ENCOUNTER — Other Ambulatory Visit: Payer: Self-pay

## 2019-04-08 DIAGNOSIS — Z01419 Encounter for gynecological examination (general) (routine) without abnormal findings: Secondary | ICD-10-CM | POA: Diagnosis not present

## 2019-04-08 DIAGNOSIS — Z13220 Encounter for screening for lipoid disorders: Secondary | ICD-10-CM | POA: Diagnosis not present

## 2019-04-09 ENCOUNTER — Encounter: Payer: Self-pay | Admitting: Women's Health

## 2019-04-09 LAB — CBC WITH DIFFERENTIAL/PLATELET
Absolute Monocytes: 437 cells/uL (ref 200–950)
Basophils Absolute: 18 cells/uL (ref 0–200)
Basophils Relative: 0.4 %
Eosinophils Absolute: 147 cells/uL (ref 15–500)
Eosinophils Relative: 3.2 %
HCT: 39.7 % (ref 35.0–45.0)
Hemoglobin: 13.2 g/dL (ref 11.7–15.5)
Lymphs Abs: 1389 cells/uL (ref 850–3900)
MCH: 28.8 pg (ref 27.0–33.0)
MCHC: 33.2 g/dL (ref 32.0–36.0)
MCV: 86.5 fL (ref 80.0–100.0)
MPV: 9.4 fL (ref 7.5–12.5)
Monocytes Relative: 9.5 %
Neutro Abs: 2608 cells/uL (ref 1500–7800)
Neutrophils Relative %: 56.7 %
Platelets: 271 10*3/uL (ref 140–400)
RBC: 4.59 10*6/uL (ref 3.80–5.10)
RDW: 13.3 % (ref 11.0–15.0)
Total Lymphocyte: 30.2 %
WBC: 4.6 10*3/uL (ref 3.8–10.8)

## 2019-04-09 LAB — COMPREHENSIVE METABOLIC PANEL
AG Ratio: 1.5 (calc) (ref 1.0–2.5)
ALT: 11 U/L (ref 6–29)
AST: 14 U/L (ref 10–35)
Albumin: 4.1 g/dL (ref 3.6–5.1)
Alkaline phosphatase (APISO): 77 U/L (ref 37–153)
BUN: 20 mg/dL (ref 7–25)
CO2: 25 mmol/L (ref 20–32)
Calcium: 9.7 mg/dL (ref 8.6–10.4)
Chloride: 107 mmol/L (ref 98–110)
Creat: 0.83 mg/dL (ref 0.50–0.99)
Globulin: 2.7 g/dL (calc) (ref 1.9–3.7)
Glucose, Bld: 96 mg/dL (ref 65–99)
Potassium: 4.2 mmol/L (ref 3.5–5.3)
Sodium: 141 mmol/L (ref 135–146)
Total Bilirubin: 0.4 mg/dL (ref 0.2–1.2)
Total Protein: 6.8 g/dL (ref 6.1–8.1)

## 2019-04-09 LAB — LIPID PANEL
Cholesterol: 235 mg/dL — ABNORMAL HIGH (ref <200)
HDL: 53 mg/dL (ref >=50)
LDL Cholesterol (Calc): 154 mg/dL (calc) — ABNORMAL HIGH
Non-HDL Cholesterol (Calc): 182 mg/dL (calc) — ABNORMAL HIGH (ref <130)
Total CHOL/HDL Ratio: 4.4 (calc) (ref <5.0)
Triglycerides: 146 mg/dL (ref <150)

## 2019-04-15 DIAGNOSIS — H04123 Dry eye syndrome of bilateral lacrimal glands: Secondary | ICD-10-CM | POA: Diagnosis not present

## 2019-04-15 DIAGNOSIS — Z9889 Other specified postprocedural states: Secondary | ICD-10-CM | POA: Diagnosis not present

## 2019-04-15 DIAGNOSIS — Z961 Presence of intraocular lens: Secondary | ICD-10-CM | POA: Diagnosis not present

## 2019-04-15 DIAGNOSIS — H2512 Age-related nuclear cataract, left eye: Secondary | ICD-10-CM | POA: Diagnosis not present

## 2019-04-20 ENCOUNTER — Ambulatory Visit
Admission: RE | Admit: 2019-04-20 | Discharge: 2019-04-20 | Disposition: A | Discharge status: Home / Self Care | Payer: BC Managed Care – PPO | Source: Ambulatory Visit

## 2019-04-20 ENCOUNTER — Other Ambulatory Visit: Payer: Self-pay

## 2019-04-20 DIAGNOSIS — Z1231 Encounter for screening mammogram for malignant neoplasm of breast: Secondary | ICD-10-CM | POA: Diagnosis not present

## 2019-07-15 DIAGNOSIS — Z23 Encounter for immunization: Secondary | ICD-10-CM | POA: Diagnosis not present

## 2019-08-08 DIAGNOSIS — Z03818 Encounter for observation for suspected exposure to other biological agents ruled out: Secondary | ICD-10-CM | POA: Diagnosis not present

## 2019-09-02 ENCOUNTER — Other Ambulatory Visit: Payer: BC Managed Care – PPO

## 2019-09-02 ENCOUNTER — Ambulatory Visit: Payer: BC Managed Care – PPO | Attending: Internal Medicine

## 2019-09-02 DIAGNOSIS — Z20822 Contact with and (suspected) exposure to covid-19: Secondary | ICD-10-CM

## 2019-09-03 LAB — NOVEL CORONAVIRUS, NAA: SARS-CoV-2, NAA: NOT DETECTED

## 2020-04-10 ENCOUNTER — Ambulatory Visit (INDEPENDENT_AMBULATORY_CARE_PROVIDER_SITE_OTHER): Payer: BC Managed Care – PPO | Admitting: Nurse Practitioner

## 2020-04-10 ENCOUNTER — Encounter: Payer: Self-pay | Admitting: Nurse Practitioner

## 2020-04-10 ENCOUNTER — Other Ambulatory Visit: Payer: Self-pay

## 2020-04-10 VITALS — BP 118/72 | Ht 65.0 in | Wt 165.0 lb

## 2020-04-10 DIAGNOSIS — Z01419 Encounter for gynecological examination (general) (routine) without abnormal findings: Secondary | ICD-10-CM | POA: Diagnosis not present

## 2020-04-10 DIAGNOSIS — Z13220 Encounter for screening for lipoid disorders: Secondary | ICD-10-CM | POA: Diagnosis not present

## 2020-04-10 DIAGNOSIS — Z90710 Acquired absence of both cervix and uterus: Secondary | ICD-10-CM | POA: Diagnosis not present

## 2020-04-10 DIAGNOSIS — Z9079 Acquired absence of other genital organ(s): Secondary | ICD-10-CM

## 2020-04-10 DIAGNOSIS — Z780 Asymptomatic menopausal state: Secondary | ICD-10-CM | POA: Diagnosis not present

## 2020-04-10 DIAGNOSIS — Z90722 Acquired absence of ovaries, bilateral: Secondary | ICD-10-CM

## 2020-04-10 DIAGNOSIS — E559 Vitamin D deficiency, unspecified: Secondary | ICD-10-CM | POA: Diagnosis not present

## 2020-04-10 LAB — COMPREHENSIVE METABOLIC PANEL
AG Ratio: 1.8 (calc) (ref 1.0–2.5)
ALT: 9 U/L (ref 6–29)
AST: 13 U/L (ref 10–35)
Albumin: 4.2 g/dL (ref 3.6–5.1)
Alkaline phosphatase (APISO): 64 U/L (ref 37–153)
BUN: 20 mg/dL (ref 7–25)
CO2: 26 mmol/L (ref 20–32)
Calcium: 9.6 mg/dL (ref 8.6–10.4)
Chloride: 107 mmol/L (ref 98–110)
Creat: 0.86 mg/dL (ref 0.50–0.99)
Globulin: 2.3 g/dL (calc) (ref 1.9–3.7)
Glucose, Bld: 88 mg/dL (ref 65–99)
Potassium: 3.7 mmol/L (ref 3.5–5.3)
Sodium: 141 mmol/L (ref 135–146)
Total Bilirubin: 0.4 mg/dL (ref 0.2–1.2)
Total Protein: 6.5 g/dL (ref 6.1–8.1)

## 2020-04-10 LAB — CBC WITH DIFFERENTIAL/PLATELET
Absolute Monocytes: 456 cells/uL (ref 200–950)
Basophils Absolute: 38 cells/uL (ref 0–200)
Basophils Relative: 0.8 %
Eosinophils Absolute: 197 cells/uL (ref 15–500)
Eosinophils Relative: 4.2 %
HCT: 38.7 % (ref 35.0–45.0)
Hemoglobin: 12.9 g/dL (ref 11.7–15.5)
Lymphs Abs: 1330 cells/uL (ref 850–3900)
MCH: 28.9 pg (ref 27.0–33.0)
MCHC: 33.3 g/dL (ref 32.0–36.0)
MCV: 86.8 fL (ref 80.0–100.0)
MPV: 9.6 fL (ref 7.5–12.5)
Monocytes Relative: 9.7 %
Neutro Abs: 2679 cells/uL (ref 1500–7800)
Neutrophils Relative %: 57 %
Platelets: 278 10*3/uL (ref 140–400)
RBC: 4.46 10*6/uL (ref 3.80–5.10)
RDW: 12.9 % (ref 11.0–15.0)
Total Lymphocyte: 28.3 %
WBC: 4.7 10*3/uL (ref 3.8–10.8)

## 2020-04-10 LAB — LIPID PANEL
Cholesterol: 208 mg/dL — ABNORMAL HIGH (ref <200)
HDL: 47 mg/dL — ABNORMAL LOW (ref >=50)
LDL Cholesterol (Calc): 135 mg/dL (calc) — ABNORMAL HIGH
Non-HDL Cholesterol (Calc): 161 mg/dL (calc) — ABNORMAL HIGH (ref <130)
Total CHOL/HDL Ratio: 4.4 (calc) (ref <5.0)
Triglycerides: 133 mg/dL (ref <150)

## 2020-04-10 LAB — VITAMIN D 25 HYDROXY (VIT D DEFICIENCY, FRACTURES): Vit D, 25-Hydroxy: 27 ng/mL — ABNORMAL LOW (ref 30–100)

## 2020-04-10 NOTE — Progress Notes (Signed)
   Connie Alvarez 03/19/1961 [id number]   History:  59 y.o. G1P2002 presents for annual exam without GYN complaints. 2002 TAH BSO for endometriosis, no HRT. Normal pap and mammogram history. Lichen sclerosis stable on Clobetasol.   Gynecologic History No LMP recorded. Patient has had a hysterectomy.   Last Pap: 06/27/2011. Results were: normal Last mammogram: 04/21/2019. Results were: normal Last colonoscopy: 09/28/2014. Results were:normal, 5-year follow up recommended Last Dexa: 02/15/2015. Results were: normal, 5-year follow up recommended   Past medical history, past surgical history, family history and social history were all reviewed and documented in the EPIC chart.  ROS:  A ROS was performed and pertinent positives and negatives are included.  Exam:  Vitals:   04/10/20 0830  Weight: 165 lb (74.8 kg)  Height: 5\' 5"  (1.651 m)   Body mass index is 27.46 kg/m.  General appearance:  Normal Thyroid:  Symmetrical, normal in size, without palpable masses or nodularity. Respiratory  Auscultation:  Clear without wheezing or rhonchi Cardiovascular  Auscultation:  Regular rate, without rubs, murmurs or gallops  Edema/varicosities:  Not grossly evident Abdominal  Soft,nontender, without masses, guarding or rebound.  Liver/spleen:  No organomegaly noted  Hernia:  None appreciated  Skin  Inspection:  Grossly normal   Breasts: Examined lying and sitting.   Right: Without masses, retractions, discharge or axillary adenopathy.   Left: Without masses, retractions, discharge or axillary adenopathy. Gentitourinary   Inguinal/mons:  Normal without inguinal adenopathy  External genitalia:  Normal  BUS/Urethra/Skene's glands:  Normal  Vagina:  Normal  Cervix:  Absent  Uterus:  Absent  Adnexa/parametria:     Rt: Without masses or tenderness.   Lt: Without masses or tenderness.  Anus and perineum: Normal  Digital rectal exam: Normal sphincter tone without palpated masses or  tenderness  Assessment/Plan:  59 y.o. [id number] for annual exam.   Well female exam with routine gynecological exam - Plan: CBC with Differential/Platelet, Comprehensive metabolic panel. Education provided on SBEs, importance of preventative screenings, current guidelines, high calcium diet, regular exercise, and multivitamin daily. Will do pap next year at 10 year interval. Discussed stopping screenings per guidelines if that pap normal. She is agreeable.   History of total abdominal hysterectomy and bilateral salpingo-oophorectomy - Plan: VITAMIN D 25 Hydroxy (Vit-D Deficiency, Fractures), DG Bone Density  Lipid screening - Plan: Lipid panel  Postmenopausal - Plan: VITAMIN D 25 Hydroxy (Vit-D Deficiency, Fractures), DG Bone Density. Vitamin D was 30 four years ago. Takes daily supplement.   She plans to schedule mammogram and DEXA at breast center  Follow up in 1 year for annual     [place], 8:31 AM 04/10/2020

## 2020-04-10 NOTE — Patient Instructions (Signed)

## 2020-04-11 ENCOUNTER — Other Ambulatory Visit: Payer: Self-pay | Admitting: Nurse Practitioner

## 2020-04-11 DIAGNOSIS — E559 Vitamin D deficiency, unspecified: Secondary | ICD-10-CM

## 2020-04-11 DIAGNOSIS — Z1231 Encounter for screening mammogram for malignant neoplasm of breast: Secondary | ICD-10-CM

## 2020-04-11 MED ORDER — VITAMIN D (ERGOCALCIFEROL) 1.25 MG (50000 UNIT) PO CAPS: 50000.0000 [IU] | ORAL_CAPSULE | ORAL | 0 refills | Status: AC

## 2020-05-30 DIAGNOSIS — Z1159 Encounter for screening for other viral diseases: Secondary | ICD-10-CM | POA: Diagnosis not present

## 2020-06-01 DIAGNOSIS — Z86010 Personal history of colonic polyps: Secondary | ICD-10-CM | POA: Diagnosis not present

## 2020-06-01 DIAGNOSIS — K5730 Diverticulosis of large intestine without perforation or abscess without bleeding: Secondary | ICD-10-CM | POA: Diagnosis not present

## 2020-06-14 DIAGNOSIS — Z23 Encounter for immunization: Secondary | ICD-10-CM | POA: Diagnosis not present

## 2020-07-12 ENCOUNTER — Other Ambulatory Visit: Payer: Self-pay

## 2020-07-12 ENCOUNTER — Ambulatory Visit: Payer: BC Managed Care – PPO

## 2020-07-12 ENCOUNTER — Ambulatory Visit
Admission: RE | Admit: 2020-07-12 | Discharge: 2020-07-12 | Disposition: A | Discharge status: Home / Self Care | Payer: BC Managed Care – PPO | Source: Ambulatory Visit | Attending: Nurse Practitioner | Admitting: Nurse Practitioner

## 2020-07-12 DIAGNOSIS — Z90710 Acquired absence of both cervix and uterus: Secondary | ICD-10-CM

## 2020-07-12 DIAGNOSIS — Z13820 Encounter for screening for osteoporosis: Secondary | ICD-10-CM | POA: Diagnosis not present

## 2020-07-12 DIAGNOSIS — Z780 Asymptomatic menopausal state: Secondary | ICD-10-CM

## 2020-08-10 DIAGNOSIS — Z20822 Contact with and (suspected) exposure to covid-19: Secondary | ICD-10-CM | POA: Diagnosis not present

## 2020-08-20 DIAGNOSIS — J069 Acute upper respiratory infection, unspecified: Secondary | ICD-10-CM | POA: Diagnosis not present

## 2020-08-28 ENCOUNTER — Ambulatory Visit
Admission: RE | Admit: 2020-08-28 | Discharge: 2020-08-28 | Disposition: A | Discharge status: Home / Self Care | Payer: BC Managed Care – PPO | Source: Ambulatory Visit | Attending: Nurse Practitioner | Admitting: Nurse Practitioner

## 2020-08-28 ENCOUNTER — Other Ambulatory Visit: Payer: Self-pay

## 2020-08-28 DIAGNOSIS — Z1231 Encounter for screening mammogram for malignant neoplasm of breast: Secondary | ICD-10-CM

## 2020-09-05 ENCOUNTER — Other Ambulatory Visit: Payer: BC Managed Care – PPO

## 2020-09-05 DIAGNOSIS — Z20822 Contact with and (suspected) exposure to covid-19: Secondary | ICD-10-CM

## 2020-09-07 LAB — SARS-COV-2, NAA 2 DAY TAT

## 2020-09-07 LAB — NOVEL CORONAVIRUS, NAA: SARS-CoV-2, NAA: NOT DETECTED

## 2021-04-11 ENCOUNTER — Ambulatory Visit (INDEPENDENT_AMBULATORY_CARE_PROVIDER_SITE_OTHER): Payer: BC Managed Care – PPO | Admitting: Nurse Practitioner

## 2021-04-11 ENCOUNTER — Other Ambulatory Visit: Payer: Self-pay

## 2021-04-11 ENCOUNTER — Encounter: Payer: Self-pay | Admitting: Nurse Practitioner

## 2021-04-11 VITALS — BP 118/76 | Ht 65.0 in | Wt 162.0 lb

## 2021-04-11 DIAGNOSIS — Z01419 Encounter for gynecological examination (general) (routine) without abnormal findings: Secondary | ICD-10-CM | POA: Diagnosis not present

## 2021-04-11 DIAGNOSIS — E785 Hyperlipidemia, unspecified: Secondary | ICD-10-CM

## 2021-04-11 DIAGNOSIS — Z90722 Acquired absence of ovaries, bilateral: Secondary | ICD-10-CM

## 2021-04-11 DIAGNOSIS — Z780 Asymptomatic menopausal state: Secondary | ICD-10-CM

## 2021-04-11 DIAGNOSIS — Z8639 Personal history of other endocrine, nutritional and metabolic disease: Secondary | ICD-10-CM

## 2021-04-11 DIAGNOSIS — Z9079 Acquired absence of other genital organ(s): Secondary | ICD-10-CM

## 2021-04-11 DIAGNOSIS — Z90710 Acquired absence of both cervix and uterus: Secondary | ICD-10-CM

## 2021-04-11 DIAGNOSIS — L900 Lichen sclerosus et atrophicus: Secondary | ICD-10-CM

## 2021-04-11 MED ORDER — CLOBETASOL PROPIONATE 0.05 % EX CREA: 1.0000 "application " | TOPICAL_CREAM | Freq: Two times a day (BID) | CUTANEOUS | 1 refills | Status: DC

## 2021-04-11 NOTE — Progress Notes (Signed)
   Connie Alvarez 08/12/1961 [id number]   History:  60 y.o. G1P2002 presents for annual exam without GYN complaints. Postmenopausal - no HRT, no bleeding. S/P 2002 TAH BSO for endometriosis. Normal pap and mammogram history. Lichen sclerosis stable on Clobetasol.   Gynecologic History No LMP recorded. Patient has had a hysterectomy.   Health Maintenance Last Pap: 06/27/2011. Results were: normal Last mammogram: 08/28/2020. Results were: normal Last colonoscopy: 2021. Results were: normal, 10 -year follow up recommended Last Dexa: 07/12/2020. Results were: normal, 5-year follow up recommended   Past medical history, past surgical history, family history and social history were all reviewed and documented in the EPIC chart. Married. Works for [employer]. 8 siblings. 31 yo granddaughter, another grandchild due 08/2020.   ROS:  A ROS was performed and pertinent positives and negatives are included.  Exam:  Vitals:   04/11/21 0859  BP: 118/76  Weight: 162 lb (73.5 kg)  Height: '5\' 5"'$  (1.651 m)   Body mass index is 26.96 kg/m.  General appearance:  Normal Thyroid:  Symmetrical, normal in size, without palpable masses or nodularity. Respiratory  Auscultation:  Clear without wheezing or rhonchi Cardiovascular  Auscultation:  Regular rate, without rubs, murmurs or gallops  Edema/varicosities:  Not grossly evident Abdominal  Soft,nontender, without masses, guarding or rebound.  Liver/spleen:  No organomegaly noted  Hernia:  None appreciated  Skin  Inspection:  Grossly normal   Breasts: Examined lying and sitting.   Right: Without masses, retractions, discharge or axillary adenopathy.   Left: Without masses, retractions, discharge or axillary adenopathy. Gentitourinary   Inguinal/mons:  Normal without inguinal adenopathy  External genitalia:  Normal  BUS/Urethra/Skene's glands:  Normal  Vagina:  pearly, thin appearance in figure-8 pattern consistent with LS, mild anterior fusion of  labia  Cervix:  Absent  Uterus:  Absent  Adnexa/parametria:     Rt: Without masses or tenderness.   Lt: Without masses or tenderness.  Anus and perineum: Normal  Digital rectal exam: Normal sphincter tone without palpated masses or tenderness  Assessment/Plan:  60 y.o. [id number] for annual exam.   Well female exam with routine gynecological exam - Plan: CBC with Differential/Platelet, Comprehensive metabolic panel. Education provided on SBEs, importance of preventative screenings, current guidelines, high calcium diet, regular exercise, and multivitamin daily.   Postmenopausal - no HRT.   History of total abdominal hysterectomy and bilateral salpingo-oophorectomy - 2002 for endometriosis  History of vitamin D deficiency - Plan: VITAMIN D 25 Hydroxy (Vit-D Deficiency, Fractures)  Hyperlipidemia, unspecified hyperlipidemia type - Plan: Lipid panel  Lichen sclerosus - Plan: clobetasol cream (TEMOVATE) 0.05 % as needed.   Screening for cervical cancer - Normal Pap history.  No longer screening per guidelines.   Screening for breast cancer - Normal mammogram history.  Continue annual screenings.  Normal breast exam today.  Screening for colon cancer - 2021 colonoscopy. Will repeat at GI's recommended interval.   Screening for osteoporosis- Normal Dexa 2021, will repeat at 5-year interval per recommendation. Continue Calcium + Vitamin D supplement and regular exercise.    Follow up in 1 year for annual     [place], 9:12 AM 04/11/2021

## 2021-04-12 LAB — CBC WITH DIFFERENTIAL/PLATELET
Absolute Monocytes: 473 cells/uL (ref 200–950)
Basophils Absolute: 51 cells/uL (ref 0–200)
Basophils Relative: 0.9 %
Eosinophils Absolute: 279 cells/uL (ref 15–500)
Eosinophils Relative: 4.9 %
HCT: 39.9 % (ref 35.0–45.0)
Hemoglobin: 13.4 g/dL (ref 11.7–15.5)
Lymphs Abs: 1647 cells/uL (ref 850–3900)
MCH: 28.9 pg (ref 27.0–33.0)
MCHC: 33.6 g/dL (ref 32.0–36.0)
MCV: 86.2 fL (ref 80.0–100.0)
MPV: 9.4 fL (ref 7.5–12.5)
Monocytes Relative: 8.3 %
Neutro Abs: 3249 cells/uL (ref 1500–7800)
Neutrophils Relative %: 57 %
Platelets: 279 10*3/uL (ref 140–400)
RBC: 4.63 10*6/uL (ref 3.80–5.10)
RDW: 12.8 % (ref 11.0–15.0)
Total Lymphocyte: 28.9 %
WBC: 5.7 10*3/uL (ref 3.8–10.8)

## 2021-04-12 LAB — COMPREHENSIVE METABOLIC PANEL
AG Ratio: 1.6 (calc) (ref 1.0–2.5)
ALT: 8 U/L (ref 6–29)
AST: 14 U/L (ref 10–35)
Albumin: 4.1 g/dL (ref 3.6–5.1)
Alkaline phosphatase (APISO): 71 U/L (ref 37–153)
BUN: 17 mg/dL (ref 7–25)
CO2: 25 mmol/L (ref 20–32)
Calcium: 9.6 mg/dL (ref 8.6–10.4)
Chloride: 106 mmol/L (ref 98–110)
Creat: 0.94 mg/dL (ref 0.50–1.05)
Globulin: 2.6 g/dL (calc) (ref 1.9–3.7)
Glucose, Bld: 93 mg/dL (ref 65–99)
Potassium: 4 mmol/L (ref 3.5–5.3)
Sodium: 139 mmol/L (ref 135–146)
Total Bilirubin: 0.2 mg/dL (ref 0.2–1.2)
Total Protein: 6.7 g/dL (ref 6.1–8.1)

## 2021-04-12 LAB — LIPID PANEL
Cholesterol: 207 mg/dL — ABNORMAL HIGH (ref <200)
HDL: 43 mg/dL — ABNORMAL LOW (ref >=50)
LDL Cholesterol (Calc): 134 mg/dL (calc) — ABNORMAL HIGH
Non-HDL Cholesterol (Calc): 164 mg/dL (calc) — ABNORMAL HIGH (ref <130)
Total CHOL/HDL Ratio: 4.8 (calc) (ref <5.0)
Triglycerides: 164 mg/dL — ABNORMAL HIGH (ref <150)

## 2021-04-12 LAB — VITAMIN D 25 HYDROXY (VIT D DEFICIENCY, FRACTURES): Vit D, 25-Hydroxy: 41 ng/mL (ref 30–100)

## 2021-04-24 DIAGNOSIS — R42 Dizziness and giddiness: Secondary | ICD-10-CM | POA: Diagnosis not present

## 2021-05-25 DIAGNOSIS — U071 COVID-19: Secondary | ICD-10-CM

## 2021-05-25 HISTORY — DX: COVID-19: U07.1

## 2021-06-05 DIAGNOSIS — U071 COVID-19: Secondary | ICD-10-CM | POA: Diagnosis not present

## 2021-06-05 DIAGNOSIS — J069 Acute upper respiratory infection, unspecified: Secondary | ICD-10-CM | POA: Diagnosis not present

## 2021-06-13 DIAGNOSIS — Z23 Encounter for immunization: Secondary | ICD-10-CM | POA: Diagnosis not present

## 2021-07-16 ENCOUNTER — Other Ambulatory Visit: Payer: Self-pay | Admitting: Nurse Practitioner

## 2021-07-16 DIAGNOSIS — Z1231 Encounter for screening mammogram for malignant neoplasm of breast: Secondary | ICD-10-CM

## 2021-08-02 ENCOUNTER — Telehealth: Payer: Self-pay | Admitting: *Deleted

## 2021-08-02 NOTE — Telephone Encounter (Signed)
Connie Alvarez patient, patient called c/o vaginal itching and irritation. She has tried OTC Monistat and no relief she asked if Rx could be sent to [facility] ?  Please advise

## 2021-08-05 MED ORDER — FLUCONAZOLE 150 MG PO TABS: 150.0000 mg | ORAL_TABLET | Freq: Once | ORAL | 0 refills | Status: AC

## 2021-08-05 NOTE — Telephone Encounter (Signed)
Patient called back would like pill, Rx sent.

## 2021-08-05 NOTE — Telephone Encounter (Signed)
Per Dr.Lavoie "Yes, agree with either Fluconazole or Terazol treatment. "  Left message for patient to call to see if she still needs Rx.

## 2021-08-13 ENCOUNTER — Other Ambulatory Visit: Payer: Self-pay

## 2021-08-13 ENCOUNTER — Ambulatory Visit (INDEPENDENT_AMBULATORY_CARE_PROVIDER_SITE_OTHER): Payer: BC Managed Care – PPO | Admitting: Obstetrics and Gynecology

## 2021-08-13 ENCOUNTER — Other Ambulatory Visit (HOSPITAL_COMMUNITY)
Admission: RE | Admit: 2021-08-13 | Discharge: 2021-08-13 | Disposition: A | Discharge status: Home / Self Care | Payer: BC Managed Care – PPO | Source: Ambulatory Visit | Attending: Obstetrics and Gynecology | Admitting: Obstetrics and Gynecology

## 2021-08-13 ENCOUNTER — Encounter: Payer: Self-pay | Admitting: Obstetrics and Gynecology

## 2021-08-13 VITALS — BP 108/66 | HR 64 | Ht 65.0 in | Wt 162.0 lb

## 2021-08-13 DIAGNOSIS — N766 Ulceration of vulva: Secondary | ICD-10-CM | POA: Diagnosis not present

## 2021-08-13 DIAGNOSIS — N760 Acute vaginitis: Secondary | ICD-10-CM | POA: Insufficient documentation

## 2021-08-13 MED ORDER — FLUCONAZOLE 150 MG PO TABS: ORAL_TABLET | ORAL | 0 refills | Status: DC

## 2021-08-13 MED ORDER — CLOBETASOL PROPIONATE 0.05 % EX OINT: 1.0000 "application " | TOPICAL_OINTMENT | Freq: Two times a day (BID) | CUTANEOUS | 1 refills | Status: DC

## 2021-08-13 NOTE — Progress Notes (Signed)
GYNECOLOGY  VISIT   HPI: 60 y.o.   Married  Caucasian  female   G2P2002 with No LMP recorded. Patient has had a hysterectomy.   here for vaginal itching/irritation for 2-3 weeks. She has used OTC Monistat-3 several times and Diflucan x1. It helps but never completely resolves.  Treated by phone with Diflucan 08/02/21 after OTC Monistat did not work.   Having itching.  Waking up at night scratching.  Pain when urine hits the outside of the vulva.  No discharge.  No odor.  Area feels swollen.   No new exposures.   No recent abx.   Hx lichen sclerosus.  Uses Clobetasol once a week.  Her current symptoms feel different from her lichen sclerosus.   GYNECOLOGIC HISTORY: No LMP recorded. Patient has had a hysterectomy. Contraception:  TAH/BSO Menopausal hormone therapy:  none Last mammogram:  08-28-20 Neg/BiRads1 Last pap smear:   22012 Neg        OB History     Gravida  2   Para  2   Term  2   Preterm      AB      Living  2      SAB      IAB      Ectopic      Multiple      Live Births                 Patient Active Problem List   Diagnosis Date Noted   History of total abdominal hysterectomy and bilateral salpingo-oophorectomy 04/11/2021   Endometriosis    Lichen sclerosus    Benign colonic polyp     Past Medical History:  Diagnosis Date   Bursitis    COVID 05/2021   Endometriosis    Lichen sclerosus     Past Surgical History:  Procedure Laterality Date   ABDOMINAL HYSTERECTOMY  2002   TAH,BSO/ENDOMETRIOSIS   [place]   '81 RIGHT AND '89 LEFT   cataract surgery     right eye   OOPHORECTOMY     BSO    Current Outpatient Medications  Medication Sig Dispense Refill   clobetasol cream (TEMOVATE) 4.76 % Apply 1 application topically 2 (two) times daily. 45 g 1   Ibuprofen (ADVIL PO) Take by mouth as needed.      Multiple Vitamin (MULTIVITAMIN) capsule Take 1 capsule by mouth daily.       vitamin E 45 MG (100 UNITS)  capsule 1 capsule     No current facility-administered medications for this visit.     ALLERGIES: Patient has no known allergies.  Family History  Problem Relation Age of Onset   Diabetes Mother    Hypertension Mother    Diabetes Sister     Social History   Socioeconomic History   Marital status: Married    Spouse name: Not on file   Number of children: Not on file   Years of education: Not on file   Highest education level: Not on file  Occupational History   Not on file  Tobacco Use   Smoking status: Never   Smokeless tobacco: Never  Vaping Use   Vaping Use: Never used  Substance and Sexual Activity   Alcohol use: Yes    Alcohol/week: 0.0 standard drinks    Comment: rare   Drug use: No   Sexual activity: Yes    Birth control/protection: Surgical    Comment: INTERCOURSE AGE 51, SEXUAL PARTNERS LESS THAN  5  Other Topics Concern   Not on file  Social History Narrative   Not on file   Social Determinants of Health   Financial Resource Strain: Not on file  Food Insecurity: Not on file  Transportation Needs: Not on file  Physical Activity: Not on file  Stress: Not on file  Social Connections: Not on file  Intimate Partner Violence: Not on file    Review of Systems  Genitourinary:  Positive for vaginal pain (vaginal itching/irritation).  All other systems reviewed and are negative.  PHYSICAL EXAMINATION:    BP 108/66    Pulse 64    Ht 5\' 5"  (1.651 m)    Wt 162 lb (73.5 kg)    SpO2 99%    BMI 26.96 kg/m     General appearance: alert, cooperative and appears stated age   Pelvic: External genitalia:  multiple linear ulcerations of the vulva.               Urethra:  normal appearing urethra with no masses, tenderness or lesions              Bartholins and Skenes: normal                 Vagina: normal appearing vagina with normal color and discharge, no lesions              Cervix: absent.                 Bimanual Exam:  Uterus:  absent.                Adnexa: no mass, fullness, tenderness       Mirror used to show patient the area of ulceration of the vulva.   Chaperone was present for exam:  Estill Bamberg, CMA  ASSESSMENT  Vulvovaginitis.  Vulvar ulceration. I suspect lichen sclerosus and some yeast.  Status post TAH/BSO.   PLAN  Diflucan 150 mg po q 72 hrs x 3 doses.  Switch to Clobetasol ointment 0.05% bid x 1 month.  HSV PCR testing of right labia.  Wound culture of right labia.  Ancillary vaginitis testing.  Avoid perfumed products.  Fu for a recheck in 2 - 3 weeks.   An After Visit Summary was printed and given to the patient.  30 min  total time was spent for this patient encounter, including preparation, face-to-face counseling with the patient, coordination of care, and documentation of the encounter.

## 2021-08-14 LAB — CERVICOVAGINAL ANCILLARY ONLY
Bacterial Vaginitis (gardnerella): NEGATIVE
Candida Glabrata: NEGATIVE
Candida Vaginitis: NEGATIVE
Comment: NEGATIVE
Comment: NEGATIVE
Comment: NEGATIVE
Comment: NEGATIVE
Trichomonas: NEGATIVE

## 2021-08-15 LAB — SURESWAB HSV, TYPE 1/2 DNA, PCR
HSV 1 DNA: NOT DETECTED
HSV 2 DNA: NOT DETECTED

## 2021-08-18 LAB — WOUND CULTURE
MICRO NUMBER:: 12780240
SPECIMEN QUALITY:: ADEQUATE

## 2021-08-21 ENCOUNTER — Other Ambulatory Visit: Payer: Self-pay

## 2021-08-21 MED ORDER — SULFAMETHOXAZOLE-TRIMETHOPRIM 800-160 MG PO TABS: 1.0000 | ORAL_TABLET | Freq: Two times a day (BID) | ORAL | 0 refills | Status: DC

## 2021-08-30 DIAGNOSIS — Z1231 Encounter for screening mammogram for malignant neoplasm of breast: Secondary | ICD-10-CM

## 2021-09-02 NOTE — Progress Notes (Signed)
GYNECOLOGY  VISIT   HPI: 61 y.o.   Married  Caucasian  female   G2P2002 with No LMP recorded. Patient has had a hysterectomy.   here for follow up of vulvitis.   Seen on 08/13/21 for vulvar ulcerations and history of lichen sclerosus.  Treated with Clobetasol ointment, course of Diflucan, and then Bactrim DS after wound culture showed staphylococcus aureus.  HSV PCR was negative for HSV I and II.   She states vulva feels much better.  Open sores are gone.   Now using clobetasol about every 2 days.   Has decreased sex drive and is asking about potential treatments.  Some pain with intercourse. Not necessarily dry.  Feels pressure.   Tried Estring in the past, and it was uncomfortable.  No personal stress at this time.  Daughter having a baby!  GYNECOLOGIC HISTORY: No LMP recorded. Patient has had a hysterectomy. Contraception:  TAH/BSO Menopausal hormone therapy:  none Last mammogram:   08-28-20 Neg/BiRads1 Last pap smear:  06-27-11 Neg        OB History     Gravida  2   Para  2   Term  2   Preterm      AB      Living  2      SAB      IAB      Ectopic      Multiple      Live Births                 Patient Active Problem List   Diagnosis Date Noted   History of total abdominal hysterectomy and bilateral salpingo-oophorectomy 04/11/2021   Endometriosis    Lichen sclerosus    Benign colonic polyp     Past Medical History:  Diagnosis Date   Bursitis    COVID 05/2021   Endometriosis    Lichen sclerosus     Past Surgical History:  Procedure Laterality Date   ABDOMINAL HYSTERECTOMY  2002   TAH,BSO/ENDOMETRIOSIS   [place]   '81 RIGHT AND '89 LEFT   cataract surgery     right eye   OOPHORECTOMY     BSO    Current Outpatient Medications  Medication Sig Dispense Refill   clobetasol ointment (TEMOVATE) 3.79 % Apply 1 application topically 2 (two) times daily. Use for one month. Then reduce to using twice a week at  bedtime. 60 g 1   Ibuprofen (ADVIL PO) Take by mouth as needed.      Multiple Vitamin (MULTIVITAMIN) capsule Take 1 capsule by mouth daily.       vitamin E 45 MG (100 UNITS) capsule 1 capsule     No current facility-administered medications for this visit.     ALLERGIES: Patient has no known allergies.  Family History  Problem Relation Age of Onset   Diabetes Mother    Hypertension Mother    Diabetes Sister     Social History   Socioeconomic History   Marital status: Married    Spouse name: Not on file   Number of children: Not on file   Years of education: Not on file   Highest education level: Not on file  Occupational History   Not on file  Tobacco Use   Smoking status: Never   Smokeless tobacco: Never  Vaping Use   Vaping Use: Never used  Substance and Sexual Activity   Alcohol use: Yes    Alcohol/week: 0.0 standard drinks  Comment: rare   Drug use: No   Sexual activity: Yes    Birth control/protection: Surgical    Comment: INTERCOURSE AGE 26, SEXUAL PARTNERS LESS THAN 5  Other Topics Concern   Not on file  Social History Narrative   Not on file   Social Determinants of Health   Financial Resource Strain: Not on file  Food Insecurity: Not on file  Transportation Needs: Not on file  Physical Activity: Not on file  Stress: Not on file  Social Connections: Not on file  Intimate Partner Violence: Not on file    Review of Systems  All other systems reviewed and are negative.  PHYSICAL EXAMINATION:    BP 118/70    Pulse 95    Ht 5\' 5"  (1.651 m)    Wt 162 lb (73.5 kg)    SpO2 98%    BMI 26.96 kg/m     General appearance: alert, cooperative and appears stated age   Pelvic: External genitalia:  hypopigmentation of the clitoral region, the bilateral labia minora, and perineum.              Urethra:  normal appearing urethra with no masses, tenderness or lesions              Bartholins and Skenes: normal                 Vagina: normal appearing vagina  with normal color and discharge, no lesions              Cervix: absent                Bimanual Exam:  Uterus:  absent              Adnexa: no mass, fullness, tenderness              Chaperone was present for exam:  Estill Bamberg, CMA  ASSESSMENT  Vulvitis much improved.  Lichen sclerosus.   Decreased libido.   PLAN  Continue Clobetasol two to three times a week at hs.  We discussed testosterone therapy versus Wellbutrin XL.   Rx for Wellbutrin XL 150 mg daily.  We reviewed vaginal estrogens versus cooking oils to improve discomfort with intercourse.  Fu in 6 weeks.    An After Visit Summary was printed and given to the patient.  29 min  total time was spent for this patient encounter, including preparation, face-to-face counseling with the patient, coordination of care, and documentation of the encounter.

## 2021-09-03 ENCOUNTER — Encounter: Payer: Self-pay | Admitting: Obstetrics and Gynecology

## 2021-09-03 ENCOUNTER — Ambulatory Visit (INDEPENDENT_AMBULATORY_CARE_PROVIDER_SITE_OTHER): Payer: BC Managed Care – PPO | Admitting: Obstetrics and Gynecology

## 2021-09-03 ENCOUNTER — Other Ambulatory Visit: Payer: Self-pay

## 2021-09-03 VITALS — BP 118/70 | HR 95 | Ht 65.0 in | Wt 162.0 lb

## 2021-09-03 DIAGNOSIS — L900 Lichen sclerosus et atrophicus: Secondary | ICD-10-CM | POA: Diagnosis not present

## 2021-09-03 DIAGNOSIS — R6882 Decreased libido: Secondary | ICD-10-CM

## 2021-09-03 MED ORDER — BUPROPION HCL ER (XL) 150 MG PO TB24: 150.0000 mg | ORAL_TABLET | Freq: Every day | ORAL | 0 refills | Status: DC

## 2021-09-03 NOTE — Patient Instructions (Signed)
Bupropion Extended-Release Tablets (Depression/Mood Disorders) What is this medication? BUPROPION (byoo PROE pee on) treats depression. It increases norepinephrine and dopamine in the brain, hormones that help regulate mood. It belongs to a group of medications called NDRIs. This medicine may be used for other purposes; ask your health care provider or pharmacist if you have questions. COMMON BRAND NAME(S): Aplenzin, Budeprion XL, Forfivo XL, Wellbutrin XL What should I tell my care team before I take this medication? They need to know if you have any of these conditions: An eating disorder, such as anorexia or bulimia Bipolar disorder or psychosis Diabetes or high blood sugar, treated with medication Glaucoma Head injury or brain tumor Heart disease, previous heart attack, or irregular heart beat High blood pressure Kidney or liver disease Seizures (convulsions) Suicidal thoughts or a previous suicide attempt Tourette's syndrome Weight loss An unusual or allergic reaction to bupropion, other medications, foods, dyes, or preservatives Pregnant or trying to become pregnant Breast-feeding How should I use this medication? Take this medication by mouth with a glass of water. Follow the directions on the prescription label. You can take it with or without food. If it upsets your stomach, take it with food. Do not crush, chew, or cut these tablets. This medication is taken once daily at the same time each day. Do not take your medication more often than directed. Do not stop taking this medication suddenly except upon the advice of your care team. Stopping this medication too quickly may cause serious side effects or your condition may worsen. A special MedGuide will be given to you by the pharmacist with each prescription and refill. Be sure to read this information carefully each time. Talk to your care team about the use of this medication in children. Special care may be needed. Overdosage:  If you think you have taken too much of this medicine contact a poison control center or emergency room at once. NOTE: This medicine is only for you. Do not share this medicine with others. What if I miss a dose? If you miss a dose, skip the missed dose and take your next tablet at the regular time. Do not take double or extra doses. What may interact with this medication? Do not take this medication with any of the following: Linezolid MAOIs like Azilect, Carbex, Eldepryl, Marplan, Nardil, and Parnate Methylene blue (injected into a vein) Other medications that contain bupropion like Zyban This medication may also interact with the following: Alcohol Certain medications for anxiety or sleep Certain medications for blood pressure like metoprolol, propranolol Certain medications for depression or psychotic disturbances Certain medications for HIV or AIDS like efavirenz, lopinavir, nelfinavir, ritonavir Certain medications for irregular heart beat like propafenone, flecainide Certain medications for Parkinson's disease like amantadine, levodopa Certain medications for seizures like carbamazepine, phenytoin, phenobarbital Cimetidine Clopidogrel Cyclophosphamide Digoxin Furazolidone Isoniazid Nicotine Orphenadrine Procarbazine Steroid medications like prednisone or cortisone Stimulant medications for attention disorders, weight loss, or to stay awake Tamoxifen Theophylline Thiotepa Ticlopidine Tramadol Warfarin This list may not describe all possible interactions. Give your health care provider a list of all the medicines, herbs, non-prescription drugs, or dietary supplements you use. Also tell them if you smoke, drink alcohol, or use illegal drugs. Some items may interact with your medicine. What should I watch for while using this medication? Tell your care team if your symptoms do not get better or if they get worse. Visit your care team for regular checks on your progress.  Because it may take several weeks  to see the full effects of this medication, it is important to continue your treatment as prescribed. Watch for new or worsening thoughts of suicide or depression. This includes sudden changes in mood, behavior, or thoughts. These changes can happen at any time but are more common in the beginning of treatment or after a change in dose. Call your care team right away if you experience these thoughts or worsening depression. Manic episodes may happen in patients with bipolar disorder who take this medication. Watch for changes in feelings or behaviors such as feeling anxious, nervous, agitated, panicky, irritable, hostile, aggressive, impulsive, severely restless, overly excited and hyperactive, or trouble sleeping. These symptoms can happen at anytime but are more common in the beginning of treatment or after a change in dose. Call your care team right away if you notice any of these symptoms. This medication may cause serious skin reactions. They can happen weeks to months after starting the medication. Contact your care team right away if you notice fevers or flu-like symptoms with a rash. The rash may be red or purple and then turn into blisters or peeling of the skin. Or, you might notice a red rash with swelling of the face, lips or lymph nodes in your neck or under your arms. Avoid drinks that contain alcohol while taking this medication. Drinking large amounts of alcohol, using sleeping or anxiety medications, or quickly stopping the use of these agents while taking this medication may increase your risk for a seizure. Do not drive or use heavy machinery until you know how this medication affects you. This medication can impair your ability to perform these tasks. Do not take this medication close to bedtime. It may prevent you from sleeping. Your mouth may get dry. Chewing sugarless gum or sucking hard candy, and drinking plenty of water may help. Contact your care  team if the problem does not go away or is severe. The tablet shell for some brands of this medication does not dissolve. This is normal. The tablet shell may appear whole in the stool. This is not a cause for concern. What side effects may I notice from receiving this medication? Side effects that you should report to your care team as soon as possible: Allergic reactions--skin rash, itching, hives, swelling of the face, lips, tongue, or throat Increase in blood pressure Mood and behavior changes--anxiety, nervousness, confusion, hallucinations, irritability, hostility, thoughts of suicide or self-harm, worsening mood, feelings of depression Redness, blistering, peeling, or loosening of the skin, including inside the mouth Seizures Sudden eye pain or change in vision such as blurry vision, seeing halos around lights, vision loss Side effects that usually do not require medical attention (report to your care team if they continue or are bothersome): Constipation Dizziness Dry mouth Loss of appetite Nausea Tremors or shaking Trouble sleeping This list may not describe all possible side effects. Call your doctor for medical advice about side effects. You may report side effects to FDA at 1-800-FDA-1088. Where should I keep my medication? Keep out of the reach of children and pets. Store at room temperature between 15 and 30 degrees C (59 and 86 degrees F). Throw away any unused medication after the expiration date. NOTE: This sheet is a summary. It may not cover all possible information. If you have questions about this medicine, talk to your doctor, pharmacist, or health care provider.  2022 Elsevier/Gold Standard (2020-10-24 00:00:00)

## 2021-09-25 ENCOUNTER — Ambulatory Visit
Admission: RE | Admit: 2021-09-25 | Discharge: 2021-09-25 | Disposition: A | Discharge status: Home / Self Care | Payer: BC Managed Care – PPO | Source: Ambulatory Visit | Attending: Nurse Practitioner | Admitting: Nurse Practitioner

## 2021-09-25 DIAGNOSIS — Z1231 Encounter for screening mammogram for malignant neoplasm of breast: Secondary | ICD-10-CM

## 2021-10-15 NOTE — Progress Notes (Signed)
GYNECOLOGY  VISIT   HPI: 61 y.o.   Married  Caucasian  female   G2P2002 with No LMP recorded. Patient has had a hysterectomy.   here for follow up.    Here for a recheck of decreased libido treated with Wellbutrin XL.  Not noticing any change in her libido.  May be experiencing less stress.   Did have a challenging day at work today.  Using Clobetasol twice to three times weekly for lichen sclerosus.  This is working well.   Works for the [employer] for [employer].   GYNECOLOGIC HISTORY: No LMP recorded. Patient has had a hysterectomy. Contraception:  TAH/BSO Menopausal hormone therapy:  none Last mammogram:  09-25-21 Neg/Birads1 Last pap smear:   06-27-11 Neg        OB History     Gravida  2   Para  2   Term  2   Preterm      AB      Living  2      SAB      IAB      Ectopic      Multiple      Live Births                 Patient Active Problem List   Diagnosis Date Noted   History of total abdominal hysterectomy and bilateral salpingo-oophorectomy 04/11/2021   Endometriosis    Lichen sclerosus    Benign colonic polyp     Past Medical History:  Diagnosis Date   Bursitis    COVID 05/2021   Endometriosis    Lichen sclerosus     Past Surgical History:  Procedure Laterality Date   ABDOMINAL HYSTERECTOMY  2002   TAH,BSO/ENDOMETRIOSIS   [place]   '81 RIGHT AND '89 LEFT   cataract surgery     right eye   OOPHORECTOMY     BSO    Current Outpatient Medications  Medication Sig Dispense Refill   buPROPion (WELLBUTRIN XL) 150 MG 24 hr tablet Take 1 tablet (150 mg total) by mouth daily. 90 tablet 0   clobetasol ointment (TEMOVATE) 8.25 % Apply 1 application topically 2 (two) times daily. Use for one month. Then reduce to using twice a week at bedtime. 60 g 1   Ibuprofen (ADVIL PO) Take by mouth as needed.      Multiple Vitamin (MULTIVITAMIN) capsule Take 1 capsule by mouth daily.       vitamin E 45 MG (100  UNITS) capsule 1 capsule     No current facility-administered medications for this visit.     ALLERGIES: Patient has no known allergies.  Family History  Problem Relation Age of Onset   Diabetes Mother    Hypertension Mother    Diabetes Sister     Social History   Socioeconomic History   Marital status: Married    Spouse name: Not on file   Number of children: Not on file   Years of education: Not on file   Highest education level: Not on file  Occupational History   Not on file  Tobacco Use   Smoking status: Never   Smokeless tobacco: Never  Vaping Use   Vaping Use: Never used  Substance and Sexual Activity   Alcohol use: Yes    Alcohol/week: 0.0 standard drinks    Comment: rare   Drug use: No   Sexual activity: Yes    Birth control/protection: Surgical    Comment: INTERCOURSE  AGE 76, SEXUAL PARTNERS LESS THAN 5  Other Topics Concern   Not on file  Social History Narrative   Not on file   Social Determinants of Health   Financial Resource Strain: Not on file  Food Insecurity: Not on file  Transportation Needs: Not on file  Physical Activity: Not on file  Stress: Not on file  Social Connections: Not on file  Intimate Partner Violence: Not on file    Review of Systems  All other systems reviewed and are negative.  PHYSICAL EXAMINATION:    BP (!) 156/80    Pulse 76    Ht 5\' 5"  (1.651 m)    Wt 162 lb (73.5 kg)    SpO2 97%    BMI 26.96 kg/m     General appearance: alert, cooperative and appears stated age  ASSESSMENT  Decreased libido.  Medication monitoring encounter.  Wellbutrin XL 150 mg not providing adequate response.  Elevated blood pressure reading.   PLAN  Will increase to Wellbutrin XL 300 mg after review of literature in Up to Date.  We discussed that Wellbutrin may elevate blood pressure. She will monitor her blood pressure three days a week at her work and will call if her readings are 140/90 or higher. In this circumstance, I would  recommend discontinuation of the Wellbutrin.  She will wait 6 weeks to determine if she has had an improvement in her libido.  If she does not, she may consider testosterone cream. If this were to be prescribed, she will need testosterone baseline checked.    An After Visit Summary was printed and given to the patient.  20 min  total time was spent for this patient encounter, including preparation, face-to-face counseling with the patient, coordination of care, and documentation of the encounter.

## 2021-10-16 ENCOUNTER — Other Ambulatory Visit: Payer: Self-pay

## 2021-10-16 ENCOUNTER — Encounter: Payer: Self-pay | Admitting: Obstetrics and Gynecology

## 2021-10-16 ENCOUNTER — Ambulatory Visit (INDEPENDENT_AMBULATORY_CARE_PROVIDER_SITE_OTHER): Payer: BC Managed Care – PPO | Admitting: Obstetrics and Gynecology

## 2021-10-16 VITALS — BP 156/80 | HR 76 | Ht 65.0 in | Wt 162.0 lb

## 2021-10-16 DIAGNOSIS — R6882 Decreased libido: Secondary | ICD-10-CM | POA: Diagnosis not present

## 2021-10-16 DIAGNOSIS — Z5181 Encounter for therapeutic drug level monitoring: Secondary | ICD-10-CM | POA: Diagnosis not present

## 2021-10-16 MED ORDER — BUPROPION HCL ER (XL) 300 MG PO TB24: 300.0000 mg | ORAL_TABLET | Freq: Every day | ORAL | 2 refills | Status: DC

## 2021-10-16 NOTE — Patient Instructions (Signed)
Please take your blood pressure at your work three days a week and call me if it is elevated at or above 140/90.   We will increase your Wellbutrin XL to 300 mg daily.   After 6 weeks of the new dosage, we should be able to see if you are experiencing an improvement in your libido.   If the Wellbutrin XL is not working, then testosterone cream may be an option for your care instead.   Please reach out for any concerns.

## 2021-12-03 IMAGING — MG DIGITAL SCREENING BILAT W/ TOMO W/ CAD
6 of 10 series · 6 of 30 positions shown · non-contrast
Comparison: Previous exam(s).

CLINICAL DATA: Screening.

EXAM:
DIGITAL SCREENING BILATERAL MAMMOGRAM WITH TOMO AND CAD

[L CC synth-2D]
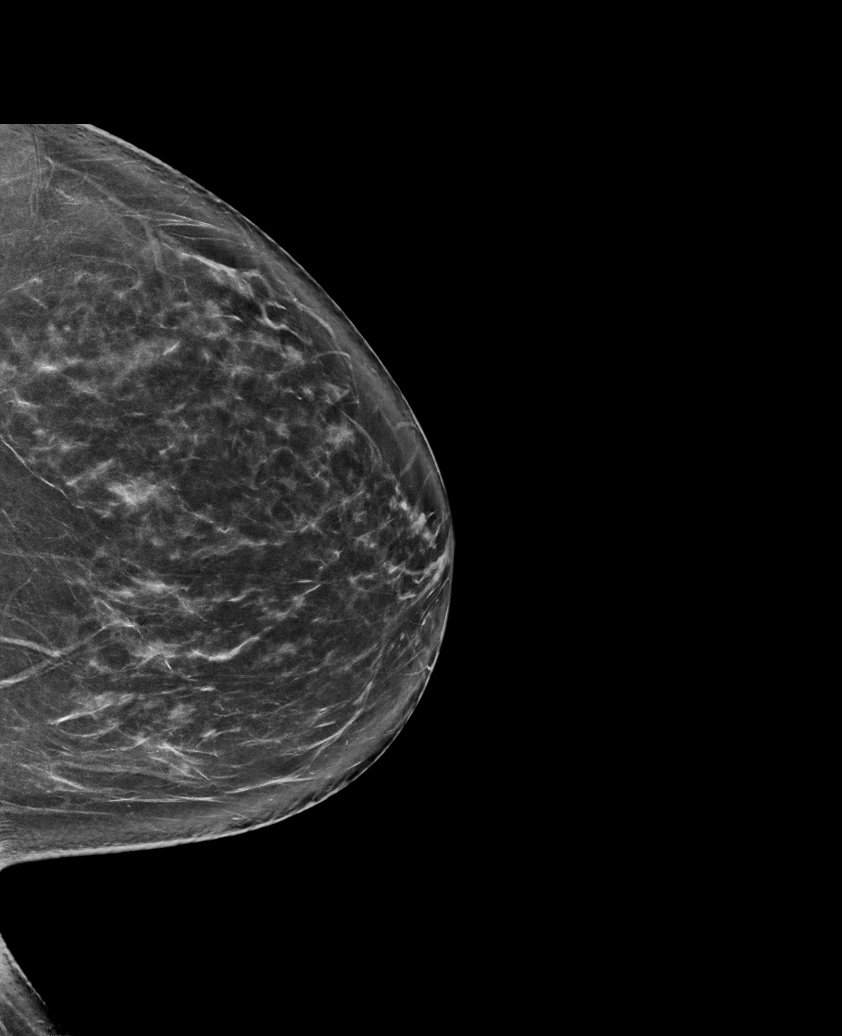

[R CC synth-2D]
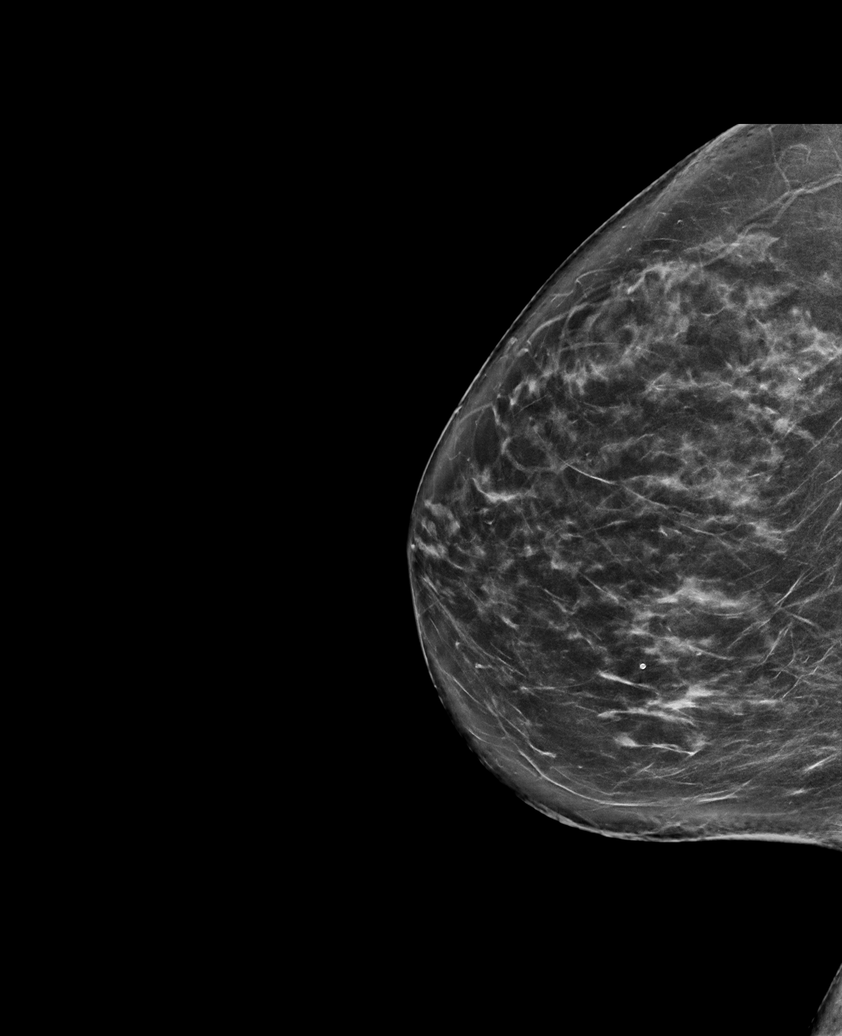

[R MLO synth-2D (1 of 2)]
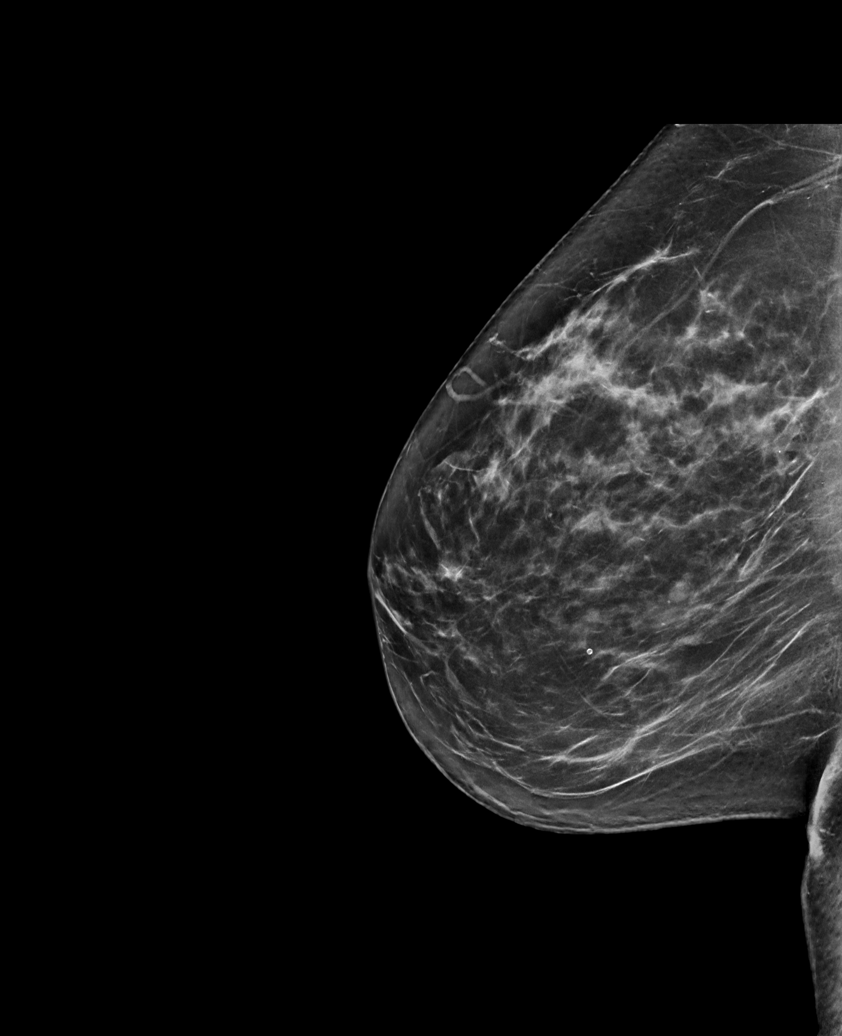

[R MLO synth-2D (2 of 2)]
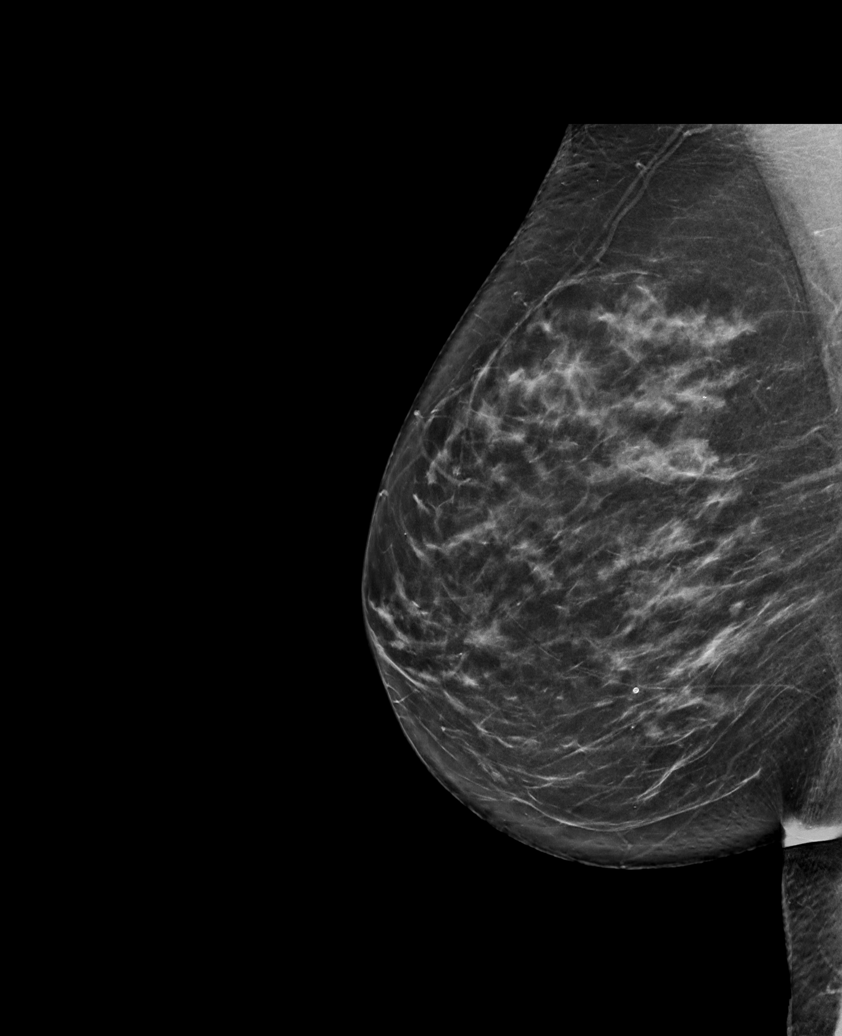

[L MLO synth-2D]
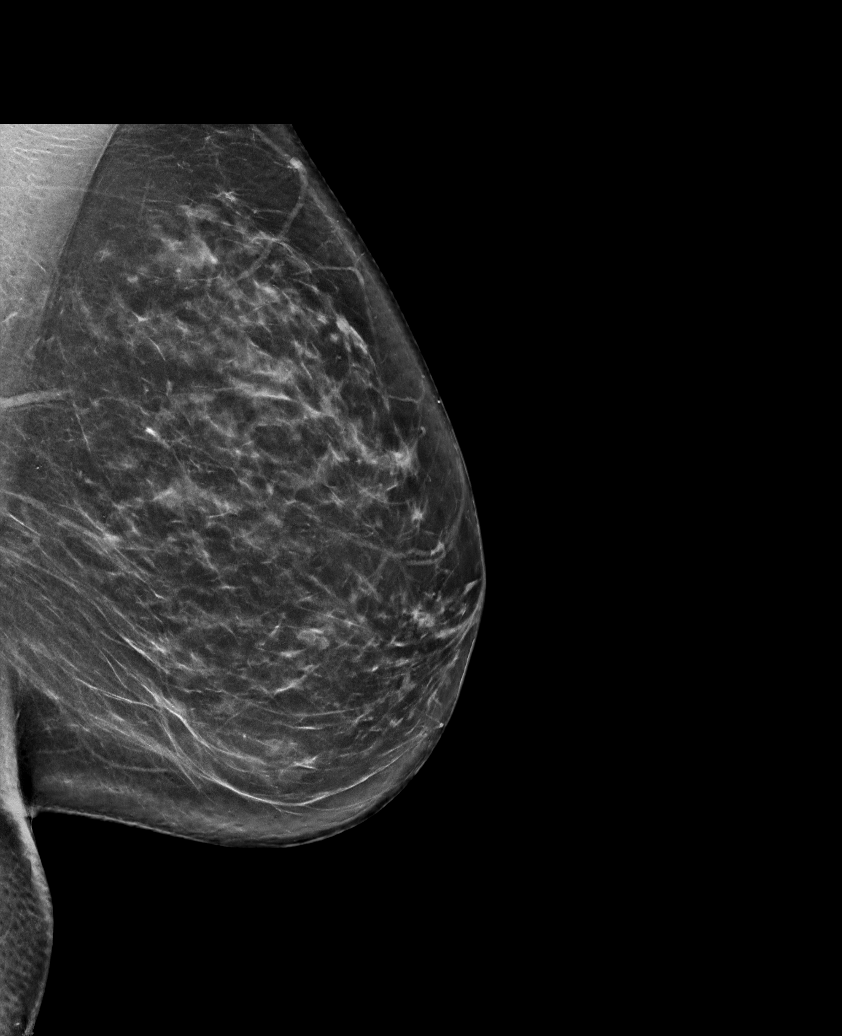

[L CC tomo · tomo slice 41/80.0]
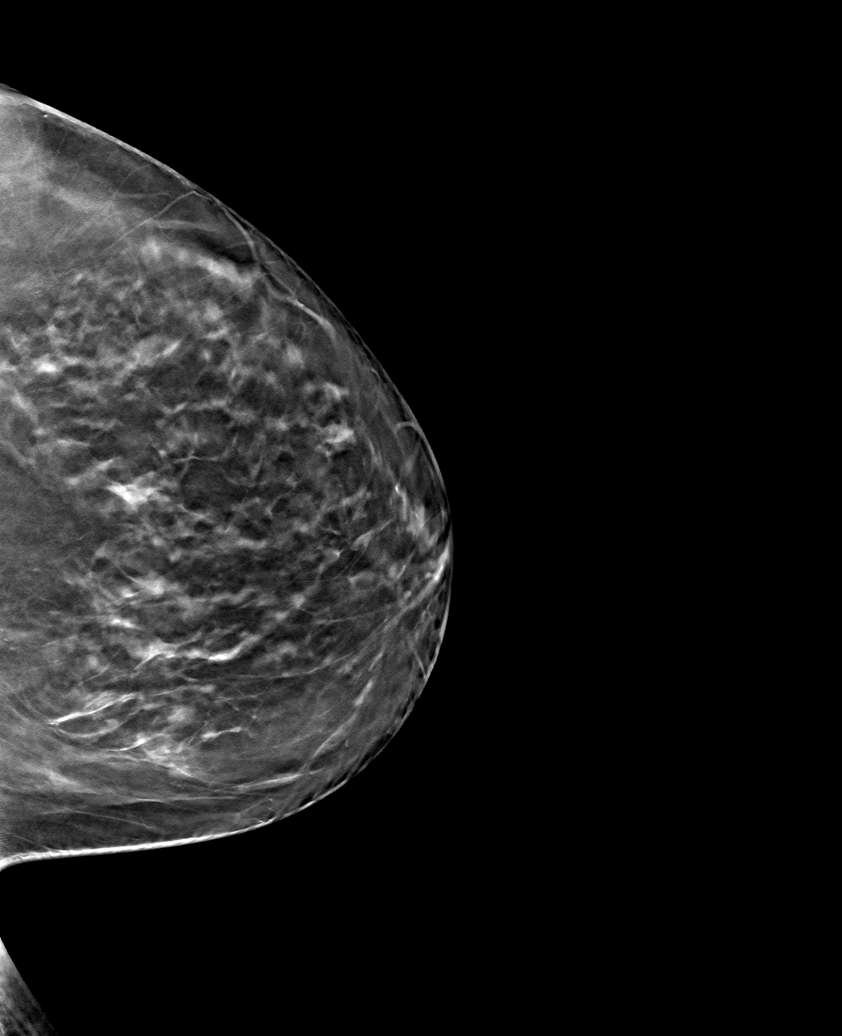

[6 of 30 positions shown; findings below may reference images not displayed]

ACR Breast Density Category c: The breast tissue is heterogeneously
dense, which may obscure small masses.
FINDINGS: There are no findings suspicious for malignancy. Images were
processed with CAD.
IMPRESSION: No mammographic evidence of malignancy. A result letter of this
screening mammogram will be mailed directly to the patient.

RECOMMENDATION:
Screening mammogram in one year. (Code:FT-U-LHB)

BI-RADS CATEGORY  1: Negative.

## 2022-04-07 DIAGNOSIS — L814 Other melanin hyperpigmentation: Secondary | ICD-10-CM | POA: Diagnosis not present

## 2022-04-07 DIAGNOSIS — L821 Other seborrheic keratosis: Secondary | ICD-10-CM | POA: Diagnosis not present

## 2022-04-07 DIAGNOSIS — L918 Other hypertrophic disorders of the skin: Secondary | ICD-10-CM | POA: Diagnosis not present

## 2022-04-07 DIAGNOSIS — D225 Melanocytic nevi of trunk: Secondary | ICD-10-CM | POA: Diagnosis not present

## 2022-04-15 ENCOUNTER — Ambulatory Visit (INDEPENDENT_AMBULATORY_CARE_PROVIDER_SITE_OTHER): Payer: BC Managed Care – PPO | Admitting: Nurse Practitioner

## 2022-04-15 ENCOUNTER — Encounter: Payer: Self-pay | Admitting: Nurse Practitioner

## 2022-04-15 VITALS — BP 110/68 | HR 78 | Resp 12 | Ht 64.75 in | Wt 162.0 lb

## 2022-04-15 DIAGNOSIS — Z780 Asymptomatic menopausal state: Secondary | ICD-10-CM | POA: Diagnosis not present

## 2022-04-15 DIAGNOSIS — Z01419 Encounter for gynecological examination (general) (routine) without abnormal findings: Secondary | ICD-10-CM | POA: Diagnosis not present

## 2022-04-15 DIAGNOSIS — L900 Lichen sclerosus et atrophicus: Secondary | ICD-10-CM

## 2022-04-15 NOTE — Progress Notes (Signed)
   Connie Alvarez 07/18/1961 [id number]   History:  61 y.o. G1P2002 presents for annual exam without GYN complaints. Postmenopausal - no HRT, no bleeding. S/P 2002 TAH BSO for endometriosis. Normal pap and mammogram history. Lichen sclerosis stable on Clobetasol. Started on Wellbutrin in 2022-10-29 for deceased libido but her BP was elevated at PCP visit shortly after starting, so she stopped in case it was the cause. She is thinking about restarting but will discuss with PCP first.   Gynecologic History No LMP recorded. Patient has had a hysterectomy.   Sexually active: Yes  Health Maintenance Last Pap: 06/27/2011. Results were: Normal Last mammogram: 09/25/2021. Results were: Normal Last colonoscopy: 2022. Results were: Normal, 10-year recall Last Dexa: 07/12/2020. Results were: Normal, 5-year follow up recommended   Past medical history, past surgical history, family history and social history were all reviewed and documented in the EPIC chart. Married. Works for [employer]. 8 siblings. 2 children. Daughter has 25 mo son and 80 yo daughter.   ROS:  A ROS was performed and pertinent positives and negatives are included.  Exam:  Vitals:   04/15/22 0928  BP: 110/68  Pulse: 78  Resp: 12  Weight: 162 lb (73.5 kg)  Height: 5' 4.75" (1.645 m)    Body mass index is 27.17 kg/m.  General appearance:  Normal Thyroid:  Symmetrical, normal in size, without palpable masses or nodularity. Respiratory  Auscultation:  Clear without wheezing or rhonchi Cardiovascular  Auscultation:  Regular rate, without rubs, murmurs or gallops  Edema/varicosities:  Not grossly evident Abdominal  Soft,nontender, without masses, guarding or rebound.  Liver/spleen:  No organomegaly noted  Hernia:  None appreciated  Skin  Inspection:  Grossly normal   Breasts: Examined lying and sitting.   Right: Without masses, retractions, discharge or axillary adenopathy.   Left: Without masses, retractions, discharge  or axillary adenopathy. Gentitourinary   Inguinal/mons:  Normal without inguinal adenopathy  External genitalia:  Normal  BUS/Urethra/Skene's glands:  Normal  Vagina:  pearly, thin appearance in figure-8 pattern consistent with LS, mild anterior fusion of labia  Cervix:  Absent  Uterus:  Absent  Adnexa/parametria:     Rt: Without masses or tenderness.   Lt: Without masses or tenderness.  Anus and perineum: Normal  Digital rectal exam: Normal sphincter tone without palpated masses or tenderness  Patient informed chaperone available to be present for breast and pelvic exam. Patient has requested no chaperone to be present. Patient has been advised what will be completed during breast and pelvic exam.   Assessment/Plan:  61 y.o. G2P2002 for annual exam.   Well female exam with routine gynecological exam - Education provided on SBEs, importance of preventative screenings, current guidelines, high calcium diet, regular exercise, and multivitamin daily.   Postmenopausal - no HRT  Lichen sclerosus - Plan: clobetasol cream (TEMOVATE) 0.05 % as needed 1-2 times per week with good management.   Screening for cervical cancer - Normal Pap history.  No longer screening per guidelines.   Screening for breast cancer - Normal mammogram history.  Continue annual screenings.  Normal breast exam today.  Screening for colon cancer - 2022 colonoscopy. Will repeat at GI's recommended interval.   Screening for osteoporosis- Normal Dexa 2021, will repeat at 5-year interval per recommendation. Continue Calcium + Vitamin D supplement and regular exercise.   Follow up in 1 year for annual.     Tamela Gammon [facility], 9:42 AM 04/15/2022

## 2022-04-30 DIAGNOSIS — Z0000 Encounter for general adult medical examination without abnormal findings: Secondary | ICD-10-CM | POA: Diagnosis not present

## 2022-04-30 DIAGNOSIS — Z23 Encounter for immunization: Secondary | ICD-10-CM | POA: Diagnosis not present

## 2022-04-30 DIAGNOSIS — Z13220 Encounter for screening for lipoid disorders: Secondary | ICD-10-CM | POA: Diagnosis not present

## 2022-05-29 DIAGNOSIS — R0981 Nasal congestion: Secondary | ICD-10-CM | POA: Diagnosis not present

## 2022-05-29 DIAGNOSIS — R519 Headache, unspecified: Secondary | ICD-10-CM | POA: Diagnosis not present

## 2022-05-29 DIAGNOSIS — Z03818 Encounter for observation for suspected exposure to other biological agents ruled out: Secondary | ICD-10-CM | POA: Diagnosis not present

## 2022-06-12 DIAGNOSIS — Z23 Encounter for immunization: Secondary | ICD-10-CM | POA: Diagnosis not present

## 2022-07-09 DIAGNOSIS — J40 Bronchitis, not specified as acute or chronic: Secondary | ICD-10-CM | POA: Diagnosis not present

## 2022-08-14 ENCOUNTER — Other Ambulatory Visit: Payer: Self-pay | Admitting: Nurse Practitioner

## 2022-08-14 DIAGNOSIS — Z1231 Encounter for screening mammogram for malignant neoplasm of breast: Secondary | ICD-10-CM

## 2022-08-22 DIAGNOSIS — J018 Other acute sinusitis: Secondary | ICD-10-CM | POA: Diagnosis not present

## 2022-09-09 ENCOUNTER — Ambulatory Visit (INDEPENDENT_AMBULATORY_CARE_PROVIDER_SITE_OTHER): Payer: BC Managed Care – PPO | Admitting: Nurse Practitioner

## 2022-09-09 VITALS — BP 102/76 | HR 72

## 2022-09-09 DIAGNOSIS — N94818 Other vulvodynia: Secondary | ICD-10-CM | POA: Diagnosis not present

## 2022-09-09 DIAGNOSIS — B3731 Acute candidiasis of vulva and vagina: Secondary | ICD-10-CM

## 2022-09-09 LAB — WET PREP FOR TRICH, YEAST, CLUE

## 2022-09-09 MED ORDER — FLUCONAZOLE 150 MG PO TABS: 150.0000 mg | ORAL_TABLET | ORAL | 0 refills | Status: DC

## 2022-09-09 NOTE — Progress Notes (Signed)
   Acute Office Visit  Subjective:    Patient ID: Connie Alvarez, female    DOB: 05/09/1961, 62 y.o.   MRN: [id number]   HPI 62 y.o. presents today for vulvar swelling and discomfort x 1 week. Has done two OTC 3-day monistat treatments and then a one-day treatment last night. Symptoms did not improve until after last nights treatment. Was on antibiotics a few weeks ago. Denies discharge or odor. No change in soaps or detergents.    Review of Systems  Constitutional: Negative.   Genitourinary:  Positive for vaginal pain (Vuvlar discomfort, swelling). Negative for genital sores and vaginal discharge.       Objective:    Physical Exam Constitutional:      Appearance: Normal appearance.  Genitourinary:    Labia:        Right: Rash present.        Left: Rash present.      Vagina: Normal.     Comments: A couple areas of excoriation    BP 102/76   Pulse 72   SpO2 99%  Wt Readings from Last 3 Encounters:  04/15/22 162 lb (73.5 kg)  10/16/21 162 lb (73.5 kg)  09/03/21 162 lb (73.5 kg)        Patient informed chaperone available to be present for breast and/or pelvic exam. Patient has requested no chaperone to be present. Patient has been advised what will be completed during breast and pelvic exam.   Wet prep negative for pathogens  Assessment & Plan:   Problem List Items Addressed This Visit   None Visit Diagnoses     Vulvar candidiasis    -  Primary   Relevant Medications   fluconazole (DIFLUCAN) 150 MG tablet   Vulvar discomfort       Relevant Orders   WET PREP FOR [place], YEAST, CLUE      Plan: Negative wet prep but suspicious for vulvar candida based on recent antibiotic use and some improvement in symptoms after most recent OTC course. Will treat with Diflucan. If no improvement I recommend trying a topical steroid.      Tamela Gammon DNP, 3:54 PM 09/09/2022

## 2022-09-16 ENCOUNTER — Other Ambulatory Visit: Payer: Self-pay | Admitting: Nurse Practitioner

## 2022-09-16 DIAGNOSIS — N762 Acute vulvitis: Secondary | ICD-10-CM

## 2022-09-16 MED ORDER — CLOBETASOL PROPIONATE 0.05 % EX OINT: 1.0000 | TOPICAL_OINTMENT | Freq: Two times a day (BID) | CUTANEOUS | 0 refills | Status: DC

## 2022-10-06 ENCOUNTER — Ambulatory Visit
Admission: RE | Admit: 2022-10-06 | Discharge: 2022-10-06 | Disposition: A | Discharge status: Home / Self Care | Payer: BC Managed Care – PPO | Source: Ambulatory Visit | Attending: Nurse Practitioner | Admitting: Nurse Practitioner

## 2022-10-06 DIAGNOSIS — Z1231 Encounter for screening mammogram for malignant neoplasm of breast: Secondary | ICD-10-CM

## 2022-10-08 ENCOUNTER — Other Ambulatory Visit: Payer: Self-pay | Admitting: Nurse Practitioner

## 2022-10-08 DIAGNOSIS — R928 Other abnormal and inconclusive findings on diagnostic imaging of breast: Secondary | ICD-10-CM

## 2022-10-20 ENCOUNTER — Ambulatory Visit
Admission: RE | Admit: 2022-10-20 | Discharge: 2022-10-20 | Disposition: A | Discharge status: Home / Self Care | Payer: BC Managed Care – PPO | Source: Ambulatory Visit | Attending: Nurse Practitioner | Admitting: Nurse Practitioner

## 2022-10-20 ENCOUNTER — Other Ambulatory Visit: Payer: Self-pay | Admitting: Nurse Practitioner

## 2022-10-20 DIAGNOSIS — N6315 Unspecified lump in the right breast, overlapping quadrants: Secondary | ICD-10-CM | POA: Diagnosis not present

## 2022-10-20 DIAGNOSIS — R928 Other abnormal and inconclusive findings on diagnostic imaging of breast: Secondary | ICD-10-CM

## 2022-10-20 DIAGNOSIS — N6489 Other specified disorders of breast: Secondary | ICD-10-CM

## 2022-10-20 DIAGNOSIS — N6310 Unspecified lump in the right breast, unspecified quadrant: Secondary | ICD-10-CM

## 2022-10-27 ENCOUNTER — Ambulatory Visit
Admission: RE | Admit: 2022-10-27 | Discharge: 2022-10-27 | Disposition: A | Discharge status: Home / Self Care | Payer: BC Managed Care – PPO | Source: Ambulatory Visit | Attending: Nurse Practitioner | Admitting: Nurse Practitioner

## 2022-10-27 DIAGNOSIS — N6310 Unspecified lump in the right breast, unspecified quadrant: Secondary | ICD-10-CM

## 2022-10-27 DIAGNOSIS — C50811 Malignant neoplasm of overlapping sites of right female breast: Secondary | ICD-10-CM | POA: Diagnosis not present

## 2022-10-27 DIAGNOSIS — N6489 Other specified disorders of breast: Secondary | ICD-10-CM

## 2022-10-27 DIAGNOSIS — Z170 Estrogen receptor positive status [ER+]: Secondary | ICD-10-CM | POA: Diagnosis not present

## 2022-10-27 DIAGNOSIS — N6315 Unspecified lump in the right breast, overlapping quadrants: Secondary | ICD-10-CM | POA: Diagnosis not present

## 2022-10-27 HISTORY — PX: BREAST BIOPSY: SHX20

## 2022-10-29 ENCOUNTER — Telehealth: Payer: Self-pay | Admitting: Hematology and Oncology

## 2022-10-29 NOTE — Telephone Encounter (Signed)
Spoke to patient to confirm upcoming morning [facility] clinic appointment on 3/13, paperwork will be sent via mail.   Gave location and time, also informed patient that the surgeon's office would be calling as well to get information from them similar to the packet that they will be receiving so make sure to do both.  Reminded patient that all providers will be coming to the clinic to see them HERE and if they had any questions to not hesitate to reach back out to myself or their navigators.

## 2022-11-04 ENCOUNTER — Encounter: Payer: Self-pay | Admitting: *Deleted

## 2022-11-04 NOTE — Progress Notes (Signed)
Radiation Oncology         [phone] ________________________________  Initial Outpatient Consultation  Name: Connie Alvarez MRN: [id number]  Date: 11/05/2022  DOB: [birth year]  [id number], Dwyane Luo, MD  Coralie Keens, MD   REFERRING PHYSICIAN: Coralie Keens, MD  DIAGNOSIS: No diagnosis found.   Cancer Staging  No matching staging information was found for the patient.  Stage *** Upper-Central Right Breast, Invasive ductal carcinoma with intermediate grade DCIS, ER+ / PR+ / Her2-, Grade 2  CHIEF COMPLAINT: Here to discuss management of right breast cancer  HISTORY OF PRESENT ILLNESS::Connie Alvarez is a 62 y.o. female who presented with a right breast abnormality on the following imaging: bilateral screening mammogram on the date of 10/06/22. No symptoms, if any, were reported at that time. Right breast diagnostic breast mammogram and right breast ultrasound on 10/20/22 further revealed a 5 x 4 x 3 mm irregular mass in the 12 o'clock right breast, 3 cmfn, with surrounding increased echogenicity. (The mass including the ill-defined increased echogenicity spans an area measuring 7 x 6 x 4 mm). No abnormal right axillary lymph nodes were appreciated.    Biopsy of the 12 o'clock right breast mass on date of 10/27/22 showed grade 2 invasive ductal carcinoma measuring 4 mm in the greatest linear extent of the sample with intermediate grade DCIS.  ER status: 100% positive and PR status 30% positive, both with strong staining intensity; Proliferation marker Ki67 at 15%; Her2 status negative; Grade 2. No lymph nodes were examined.  ***  PREVIOUS RADIATION THERAPY: No  PAST MEDICAL HISTORY:  has a past medical history of Bursitis, COVID (05/2021), Endometriosis, and Lichen sclerosus.    PAST SURGICAL HISTORY: Past Surgical History:  Procedure Laterality Date   ABDOMINAL HYSTERECTOMY  2002   TAH,BSO/ENDOMETRIOSIS   BREAST BIOPSY Right 10/27/2022   [place] RT BREAST BX W LOC DEV 1ST  LESION IMG BX SPEC US GUIDE 10/27/2022 [facility] M[facility]MO[facility]   BUNIONECTOMY  1981 AND 1989   '81 RIGHT AND '89 LEFT   cataract surgery     right eye   OOPHORECTOMY     BSO    FAMILY HISTORY: family history includes Diabetes in her mother and sister; Hypertension in her mother.  SOCIAL HISTORY:  reports that she has never smoked. She has never used smokeless tobacco. She reports current alcohol use. She reports that she does not use drugs.  ALLERGIES: Patient has no known allergies.  MEDICATIONS:  Current Outpatient Medications  Medication Sig Dispense Refill   clobetasol ointment (TEMOVATE) [id number] % Apply 1 Application topically 2 (two) times daily. 30 g 0   fluconazole (DIFLUCAN) 150 MG tablet Take 1 tablet (150 mg total) by mouth every 3 (three) days. 2 tablet 0   Ibuprofen (ADVIL PO) Take by mouth as needed.      Multiple Vitamin (MULTIVITAMIN) capsule Take 1 capsule by mouth daily.       VITAMIN D PO Take by mouth.     vitamin E 45 MG (100 UNITS) capsule 1 capsule     No current facility-administered medications for this encounter.    REVIEW OF SYSTEMS: As above in HPI.   PHYSICAL EXAM:  vitals were not taken for this visit.   General: Alert and oriented, in no acute distress HEENT: Head is normocephalic. Extraocular movements are intact. Oropharynx is clear. Neck: Neck is supple, no palpable cervical or supraclavicular lymphadenopathy. Heart: Regular in rate and rhythm with no murmurs, rubs, or gallops. Chest: Clear to  auscultation bilaterally, with no rhonchi, wheezes, or rales. Abdomen: Soft, nontender, nondistended, with no rigidity or guarding. Extremities: No cyanosis or edema. Lymphatics: see Neck Exam Skin: No concerning lesions. Musculoskeletal: symmetric strength and muscle tone throughout. Neurologic: Cranial nerves II through XII are grossly intact. No obvious focalities. Speech is fluent. Coordination is intact. Psychiatric: Judgment and insight are intact. Affect  is appropriate. Breasts: *** . No other palpable masses appreciated in the breasts or axillae *** .    ECOG = ***  0 - Asymptomatic (Fully active, able to carry on all predisease activities without restriction)  1 - Symptomatic but completely ambulatory (Restricted in physically strenuous activity but ambulatory and able to carry out work of a light or sedentary nature. For example, light housework, office work)  2 - Symptomatic, <50% in bed during the day (Ambulatory and capable of all self care but unable to carry out any work activities. Up and about more than 50% of waking hours)  3 - Symptomatic, >50% in bed, but not bedbound (Capable of only limited self-care, confined to bed or chair 50% or more of waking hours)  4 - Bedbound (Completely disabled. Cannot carry on any self-care. Totally confined to bed or chair)  5 - Death   Eustace Pen MM, Creech RH, Tormey DC, et al. [phone]). "Toxicity and response criteria of the [facility]". [place] Oncol. 5 (6): 649-55   LABORATORY DATA:  Lab Results  Component Value Date   WBC 5.7 04/11/2021   HGB 13.4 04/11/2021   HCT 39.9 04/11/2021   MCV 86.2 04/11/2021   PLT 279 04/11/2021   CMP     Component Value Date/Time   NA 139 04/11/2021 0922   K 4.0 04/11/2021 0922   CL 106 04/11/2021 0922   CO2 25 04/11/2021 0922   GLUCOSE 93 04/11/2021 0922   BUN 17 04/11/2021 0922   CREATININE 0.94 04/11/2021 0922   CALCIUM 9.6 04/11/2021 0922   PROT 6.7 04/11/2021 0922   ALBUMIN 4.1 01/06/2017 1232   AST 14 04/11/2021 0922   ALT 8 04/11/2021 0922   ALKPHOS 62 01/06/2017 1232   BILITOT 0.2 04/11/2021 0922         RADIOGRAPHY: [place] RT BREAST BX W LOC DEV 1ST LESION IMG BX SPEC US GUIDE  Addendum Date: 10/29/2022   ADDENDUM REPORT: 10/29/2022 13:33 ADDENDUM: Pathology revealed INVASIVE MODERATELY DIFFERENTIATED DUCTAL CARCINOMA, GRADE 2, DUCTAL CARCINOMA IN SITU, INTERMEDIATE NUCLEAR GRADE, SOLID TYPE WITHOUT NECROSIS of  the RIGHT breast, 12 o'clock, (coil clip). This was found to be concordant by Dr. Lillia Mountain. Pathology results were discussed with the patient by telephone. The patient reported doing well after the biopsy with tenderness at the site. Post biopsy instructions and care were reviewed and questions were answered. The patient was encouraged to call [facility] for any additional concerns. The patient was referred to [facility] on November 05, 2022. Pathology results reported by Stacie Acres RN on 10/28/2022. Electronically Signed   By: Lillia Mountain M.D.   On: 10/29/2022 13:33   Result Date: 10/29/2022 CLINICAL DATA:  Suspicious right breast mass. EXAM: ULTRASOUND GUIDED RIGHT BREAST CORE NEEDLE BIOPSY COMPARISON:  Previous exam(s). PROCEDURE: I met with the patient and we discussed the procedure of ultrasound-guided biopsy, including benefits and alternatives. We discussed the high likelihood of a successful procedure. We discussed the risks of the procedure, including infection, bleeding, tissue  injury, clip migration, and inadequate sampling. Informed written consent was given. The usual time-out protocol was performed immediately prior to the procedure. Lesion quadrant: 12 o'clock Using sterile technique and 1% Lidocaine as local anesthetic, under direct ultrasound visualization, a 14 gauge spring-loaded device was used to perform biopsy of a mass in the 12 o'clock region of the right breast using a lateral to medial approach. At the conclusion of the procedure coil shaped tissue marker clip was deployed into the biopsy cavity. Follow up 2 view mammogram was performed and dictated separately. IMPRESSION: Ultrasound guided biopsy of the right breast. No apparent complications. Electronically Signed: By: Lillia Mountain M.D. On: 10/27/2022 14:15   MM CLIP PLACEMENT RIGHT  Result Date: 10/27/2022 CLINICAL DATA:  Status  post ultrasound-guided core biopsy of a right breast mass. EXAM: 3D DIAGNOSTIC RIGHT MAMMOGRAM POST ULTRASOUND BIOPSY COMPARISON:  Previous exam(s). FINDINGS: 3D Mammographic images were obtained following ultrasound guided biopsy of a right breast mass. The biopsy marking clip is in expected location in the 12 o'clock region of the right breast. IMPRESSION: Appropriate positioning of the coil shaped biopsy marking clip at the site of biopsy in the 12 o'clock region of the right breast. Final Assessment: Post Procedure Mammograms for Marker Placement Electronically Signed   By: Lillia Mountain M.D.   On: 10/27/2022 14:24  MM DIAG BREAST TOMO UNI RIGHT  Result Date: 10/20/2022 CLINICAL DATA:  Possible distortion in the 12 o'clock position of the right breast on a recent screening mammogram. No previous breast procedures. EXAM: DIGITAL DIAGNOSTIC UNILATERAL RIGHT MAMMOGRAM WITH TOMOSYNTHESIS; ULTRASOUND RIGHT BREAST LIMITED TECHNIQUE: Right digital diagnostic mammography and breast tomosynthesis was performed.; Targeted ultrasound examination of the right breast was performed COMPARISON:  Previous exam(s). ACR Breast Density Category c: The breasts are heterogeneously dense, which may obscure small masses. FINDINGS: 3D tomographic and 2D generated spot compression images of the right breast confirm an area of focal distortion in the 12 o'clock position of the mid to anterior right breast. On physical exam, no mass is palpable in the 12 o'clock position of the right breast. Targeted ultrasound is performed, showing a 5 x 4 x 3 mm irregular, vertically oriented, heterogeneous mass with ill-defined surrounding increased echogenicity and posterior acoustical shadowing in the 12 o'clock position of the right breast, 3 cm from the nipple. Including the ill-defined increased echogenicity, this measures 7 x 6 x 4 mm. Ultrasound of the right axilla demonstrated normal appearing right axillary lymph nodes. IMPRESSION: 7 mm mass  in the 12 o'clock position of the right breast with imaging features suspicious for malignancy, including associated focal distortion. RECOMMENDATION: Ultrasound-guided core needle biopsy of the 7 mm mass in the 12 o'clock position of the right breast. This has been discussed with the patient and she is being assisted in getting the biopsy scheduled. I have discussed the findings and recommendations with the patient. If applicable, a reminder letter will be sent to the patient regarding the next appointment. BI-RADS CATEGORY  4: Suspicious. Electronically Signed   By: Claudie Revering M.D.   On: 10/20/2022 12:35  US BREAST LTD UNI RIGHT INC AXILLA  Result Date: 10/20/2022 CLINICAL DATA:  Possible distortion in the 12 o'clock position of the right breast on a recent screening mammogram. No previous breast procedures. EXAM: DIGITAL DIAGNOSTIC UNILATERAL RIGHT MAMMOGRAM WITH TOMOSYNTHESIS; ULTRASOUND RIGHT BREAST LIMITED TECHNIQUE: Right digital diagnostic mammography and breast tomosynthesis was performed.; Targeted ultrasound examination of the right breast was performed COMPARISON:  Previous exam(s). ACR  Breast Density Category c: The breasts are heterogeneously dense, which may obscure small masses. FINDINGS: 3D tomographic and 2D generated spot compression images of the right breast confirm an area of focal distortion in the 12 o'clock position of the mid to anterior right breast. On physical exam, no mass is palpable in the 12 o'clock position of the right breast. Targeted ultrasound is performed, showing a 5 x 4 x 3 mm irregular, vertically oriented, heterogeneous mass with ill-defined surrounding increased echogenicity and posterior acoustical shadowing in the 12 o'clock position of the right breast, 3 cm from the nipple. Including the ill-defined increased echogenicity, this measures 7 x 6 x 4 mm. Ultrasound of the right axilla demonstrated normal appearing right axillary lymph nodes. IMPRESSION: 7 mm mass in  the 12 o'clock position of the right breast with imaging features suspicious for malignancy, including associated focal distortion. RECOMMENDATION: Ultrasound-guided core needle biopsy of the 7 mm mass in the 12 o'clock position of the right breast. This has been discussed with the patient and she is being assisted in getting the biopsy scheduled. I have discussed the findings and recommendations with the patient. If applicable, a reminder letter will be sent to the patient regarding the next appointment. BI-RADS CATEGORY  4: Suspicious. Electronically Signed   By: Claudie Revering M.D.   On: 10/20/2022 12:35  MM 3D SCREEN BREAST BILATERAL  Result Date: 10/08/2022 CLINICAL DATA:  Screening. EXAM: DIGITAL SCREENING BILATERAL MAMMOGRAM WITH TOMOSYNTHESIS AND CAD TECHNIQUE: Bilateral screening digital craniocaudal and mediolateral oblique mammograms were obtained. Bilateral screening digital breast tomosynthesis was performed. The images were evaluated with computer-aided detection. COMPARISON:  Previous exam(s). ACR Breast Density Category c: The breasts are heterogeneously dense, which may obscure small masses. FINDINGS: In the right breast, possible distortion warrants further evaluation. In the left breast, no findings suspicious for malignancy. IMPRESSION: Further evaluation is suggested for possible distortion in the right breast. RECOMMENDATION: Diagnostic mammogram and possibly ultrasound of the right breast. (Code:[id number]) The patient will be contacted regarding the findings, and additional imaging will be scheduled. BI-RADS CATEGORY  0: Incomplete: Need additional imaging evaluation. Electronically Signed   By: Lovey Newcomer M.D.   On: 10/08/2022 05:54      IMPRESSION/PLAN: ***   It was a pleasure meeting the patient today. We discussed the risks, benefits, and side effects of radiotherapy. I recommend radiotherapy to the *** to reduce her risk of locoregional recurrence by 2/3.  We discussed that  radiation would take approximately *** weeks to complete and that I would give the patient a few weeks to heal following surgery before starting treatment planning. *** If chemotherapy were to be given, this would precede radiotherapy. We spoke about acute effects including skin irritation and fatigue as well as much less common late effects including internal organ injury or irritation. We spoke about the latest technology that is used to minimize the risk of late effects for patients undergoing radiotherapy to the breast or chest wall. No guarantees of treatment were given. The patient is enthusiastic about proceeding with treatment. I look forward to participating in the patient's care.  I will await her referral back to me for postoperative follow-up and eventual CT simulation/treatment planning.  On date of service, in total, I spent *** minutes on this encounter. Patient was seen in person.   __________________________________________   Eppie Gibson, MD  This document serves as a record of services personally performed by Eppie Gibson, MD. It was created on her behalf by [facility]  Mare Ferrari, a trained medical scribe. The creation of this record is based on the scribe's personal observations and the provider's statements to them. This document has been checked and approved by the attending provider.

## 2022-11-05 ENCOUNTER — Ambulatory Visit: Payer: BC Managed Care – PPO | Attending: Surgery | Admitting: Physical Therapy

## 2022-11-05 ENCOUNTER — Encounter: Payer: Self-pay | Admitting: Radiation Oncology

## 2022-11-05 ENCOUNTER — Encounter: Payer: Self-pay | Admitting: Hematology and Oncology

## 2022-11-05 ENCOUNTER — Other Ambulatory Visit: Payer: Self-pay | Admitting: Surgery

## 2022-11-05 ENCOUNTER — Inpatient Hospital Stay: Payer: BC Managed Care – PPO | Admitting: Licensed Clinical Social Worker

## 2022-11-05 ENCOUNTER — Encounter: Payer: Self-pay | Admitting: Genetic Counselor

## 2022-11-05 ENCOUNTER — Other Ambulatory Visit: Payer: Self-pay

## 2022-11-05 ENCOUNTER — Inpatient Hospital Stay (HOSPITAL_BASED_OUTPATIENT_CLINIC_OR_DEPARTMENT_OTHER): Payer: BC Managed Care – PPO | Admitting: Genetic Counselor

## 2022-11-05 ENCOUNTER — Ambulatory Visit
Admission: RE | Admit: 2022-11-05 | Discharge: 2022-11-05 | Disposition: A | Discharge status: Home / Self Care | Payer: BC Managed Care – PPO | Source: Ambulatory Visit | Attending: Radiation Oncology | Admitting: Radiation Oncology

## 2022-11-05 ENCOUNTER — Inpatient Hospital Stay: Payer: BC Managed Care – PPO | Attending: Hematology and Oncology | Admitting: Hematology and Oncology

## 2022-11-05 ENCOUNTER — Encounter: Payer: Self-pay | Admitting: *Deleted

## 2022-11-05 ENCOUNTER — Encounter: Payer: Self-pay | Admitting: Physical Therapy

## 2022-11-05 ENCOUNTER — Inpatient Hospital Stay: Payer: BC Managed Care – PPO

## 2022-11-05 VITALS — BP 131/62 | HR 73 | Temp 97.8°F | Resp 16 | Ht 64.0 in | Wt 165.2 lb

## 2022-11-05 DIAGNOSIS — C50812 Malignant neoplasm of overlapping sites of left female breast: Secondary | ICD-10-CM | POA: Diagnosis not present

## 2022-11-05 DIAGNOSIS — Z8616 Personal history of COVID-19: Secondary | ICD-10-CM | POA: Insufficient documentation

## 2022-11-05 DIAGNOSIS — Z170 Estrogen receptor positive status [ER+]: Secondary | ICD-10-CM | POA: Diagnosis not present

## 2022-11-05 DIAGNOSIS — Z780 Asymptomatic menopausal state: Secondary | ICD-10-CM | POA: Diagnosis not present

## 2022-11-05 DIAGNOSIS — C50411 Malignant neoplasm of upper-outer quadrant of right female breast: Secondary | ICD-10-CM | POA: Insufficient documentation

## 2022-11-05 DIAGNOSIS — Z803 Family history of malignant neoplasm of breast: Secondary | ICD-10-CM | POA: Diagnosis not present

## 2022-11-05 DIAGNOSIS — Z8049 Family history of malignant neoplasm of other genital organs: Secondary | ICD-10-CM | POA: Diagnosis not present

## 2022-11-05 DIAGNOSIS — R293 Abnormal posture: Secondary | ICD-10-CM | POA: Diagnosis not present

## 2022-11-05 DIAGNOSIS — Z808 Family history of malignant neoplasm of other organs or systems: Secondary | ICD-10-CM

## 2022-11-05 DIAGNOSIS — Z90710 Acquired absence of both cervix and uterus: Secondary | ICD-10-CM | POA: Diagnosis not present

## 2022-11-05 DIAGNOSIS — C50811 Malignant neoplasm of overlapping sites of right female breast: Secondary | ICD-10-CM | POA: Diagnosis not present

## 2022-11-05 DIAGNOSIS — C50911 Malignant neoplasm of unspecified site of right female breast: Secondary | ICD-10-CM

## 2022-11-05 DIAGNOSIS — Z853 Personal history of malignant neoplasm of breast: Secondary | ICD-10-CM

## 2022-11-05 DIAGNOSIS — Z9079 Acquired absence of other genital organ(s): Secondary | ICD-10-CM | POA: Diagnosis not present

## 2022-11-05 DIAGNOSIS — Z90722 Acquired absence of ovaries, bilateral: Secondary | ICD-10-CM | POA: Insufficient documentation

## 2022-11-05 LAB — GENETIC SCREENING ORDER

## 2022-11-05 NOTE — Assessment & Plan Note (Addendum)
This is a very pleasant 62 year old postmenopausal female patient with newly diagnosed right breast invasive ductal carcinoma, grade 2 ER/PR positive HER2 negative staged as T1b N0 MX referred to breast [place] for additional recommendations.  Given very small tumor and strong ER/PR positivity, she will proceed with upfront surgery, lumpectomy followed by Oncotype testing.  We have discussed the following details about Oncotype.  We have discussed about Oncotype Dx score which is a well validated prognostic scoring system which can predict outcome with endocrine therapy alone and whether chemotherapy reduces recurrence.  Typically in patients with ER positive cancers that are node negative if the RS score is high typically greater than or equal to 26, chemotherapy is recommended.  In women with intermediate recurrence score younger than 20, there can still be some role for chemotherapy in addition to endocrine therapy especially if the recurrence score is between 21-25. If chemotherapy is needed, this will precede radiation and then after radiation she will continue on antiestrogen therapy.  If Oncotype deems that she is not a candidate for adjuvant chemotherapy, she will then proceed with adjuvant radiation followed by antiestrogen therapy.  We have discussed the following details about antiestrogen therapy.  We have discussed options for antiestrogen therapy today  With regards to Tamoxifen, we discussed that this is a SERM, selective estrogen receptor modulator. We discussed mechanism of action of Tamoxifen, adverse effects on Tamoxifen including but not limited to post menopausal symptoms, increased risk of DVT/PE, increased risk of endometrial cancer, questionable cataracts with long term use and increased risk of cardiovascular events in the study which was not statistically significant. A benefit from Tamoxifen would be improvement in bone density. With regards to aromatase inhibitors, we discussed  mechanism of action, adverse effects including but not limited to post menopausal symptoms, arthralgias, myalgias, increased risk of cardiovascular events and bone loss.  At this time she is leaning towards aromatase inhibitors.  She will return to clinic after surgery to review Oncotype results and to discuss any additional recommendations.  All her questions were answered to the best my knowledge.  Thank you for consulting [place] in the care of this patient.  Please do not hesitate to contact us with any additional questions or concerns.

## 2022-11-05 NOTE — Research (Signed)
[employer] 2021-05 - Specimen Collection Study to Evaluate Biomarkers in Subjects with Cancer    Patient Connie Alvarez was identified by DR. Iruku as a potential candidate for the above listed study.  This Clinical Research Coordinator met with Shadai Reddish, [id number], on 11/05/22 in a manner and location that ensures patient privacy to discuss participation in the above listed research study.  Patient is Accompanied by their husband .  A copy of the informed consent document with embedded HIPAA language was provided to the patient.  Patient reads, speaks, and understands [place].   Patient was provided with the business card of this Coordinator and encouraged to contact the research team with any questions.  Approximately 15 minutes were spent with the patient reviewing the informed consent documents.  Patient was provided the option of taking informed consent documents home to review and was encouraged to review at their convenience with their support network, including other care providers. Patient took the consent documents home to review.  Will follow up with the patient on 11/10/2022 to confirm interest for study participation.  Johny Drilling, [facility] 11/05/2022 4:53 PM

## 2022-11-05 NOTE — Progress Notes (Signed)
[place] Work  Initial Assessment   Connie Alvarez is a 62 y.o. year old female accompanied by husband, Bill. Clinical Social Work was referred by  [facility]  for assessment of psychosocial needs.   SDOH (Social Determinants of Health) assessments performed: Yes SDOH Interventions    Flowsheet Row Clinical Support from 11/05/2022 in [place] at [facility]  SDOH Interventions   Food Insecurity Interventions Intervention Not Indicated  Housing Interventions Intervention Not Indicated  Transportation Interventions Intervention Not Indicated  Utilities Interventions Intervention Not Indicated  Financial Strain Interventions Intervention Not Indicated       SDOH Screenings   Food Insecurity: No Food Insecurity (11/05/2022)  Housing: Low Risk  (11/05/2022)  Transportation Needs: No Transportation Needs (11/05/2022)  Utilities: Not At Risk (11/05/2022)  Financial Resource Strain: Low Risk  (11/05/2022)  Tobacco Use: Low Risk  (11/05/2022)     Distress Screen completed: Yes    11/05/2022   11:32 AM  ONCBCN DISTRESS SCREENING  Screening Type Initial Screening  Distress experienced in past week (1-10) 5  Emotional problem type Adjusting to illness  Physical Problem type Sleep/insomnia      Family/Social Information:  Housing Arrangement: patient lives with husband Family members/support persons in your life? Family (husband, 2 adult daughters- one in [place], one in [place] with 2 grandkids), Friends, and Education officer, community concerns: no  Employment: Working full time as Scientist, water quality for [employer].  Income source: Employment Financial concerns: No Type of concern: None Food access concerns: no Religious or spiritual practice: Not known Services Currently in place:  [employer]  Coping/ Adjustment to diagnosis: Patient understands treatment plan and what happens next? Yes. Information was helpful for her today  and she has spoken with a colleague who recently went through her own treatment Patient reported stressors: Adjusting to my illness Patient enjoys reading and shopping, and time with family and friends Current coping skills/ strengths: Active sense of humor , Capable of independent living , Communication skills , Motivation for treatment/growth , and Supportive family/friends     SUMMARY: Current SDOH Barriers:  No major barriers noted today  Clinical Social Work Clinical Goal(s):  No clinical social work goals at this time  Interventions: Discussed common feeling and emotions when being diagnosed with cancer, and the importance of support during treatment Informed patient of the support team roles and support services at [facility] Provided [place] contact information and encouraged patient to call with any questions or concerns   Follow Up Plan: Patient will contact CSW with any support or resource needs Patient verbalizes understanding of plan: Yes    Taeshaun Rames E Maxie Slovacek, LCSW

## 2022-11-05 NOTE — Therapy (Signed)
OUTPATIENT PHYSICAL THERAPY BREAST CANCER BASELINE EVALUATION   Patient Name: Connie Alvarez MRN: [id number] DOB:03-05-1961, 62 y.o., female Today's Date: 11/05/2022  END OF SESSION:  PT End of Session - 11/05/22 1137     Visit Number 1    Number of Visits 2    Date for PT Re-Evaluation 12/31/22    PT Start Time [id number]    PT Stop Time 1112    PT Time Calculation (min) 31 min    Activity Tolerance Patient tolerated treatment well    Behavior During Therapy [facility] for tasks assessed/performed             Past Medical History:  Diagnosis Date   Bursitis    COVID 05/2021   Endometriosis    Lichen sclerosus    Past Surgical History:  Procedure Laterality Date   ABDOMINAL HYSTERECTOMY  2002   TAH,BSO/ENDOMETRIOSIS   BREAST BIOPSY Right 10/27/2022   [place] RT BREAST BX W LOC DEV 1ST LESION IMG BX SPEC US GUIDE 10/27/2022 [facility] [place]   '81 RIGHT AND '89 LEFT   cataract surgery     right eye   OOPHORECTOMY     BSO   Patient Active Problem List   Diagnosis Date Noted   Invasive ductal carcinoma of right breast, stage 1 ([place]) 11/05/2022   Malignant neoplasm of upper-outer quadrant of right breast in female, estrogen receptor positive ([place]) 11/05/2022   History of total abdominal hysterectomy and bilateral salpingo-oophorectomy 04/11/2021   Endometriosis    Lichen sclerosus    Benign colonic polyp     REFERRING PROVIDER: Dr. Benay Pike  REFERRING DIAG: Right breast cancer  THERAPY DIAG:  Malignant neoplasm of upper-outer quadrant of right breast in female, estrogen receptor positive ([place])  Abnormal posture  Rationale for Evaluation and Treatment: Rehabilitation  ONSET DATE: 10/29/2022  SUBJECTIVE:                                                                                                                                                                                           SUBJECTIVE STATEMENT: Patient reports she is here  today to be seen by her medical team for her newly diagnosed right breast cancer.   PERTINENT HISTORY:  Patient was diagnosed on 10/29/2022 with right grade 2 invasive ductal carcinoma breast cancer. It measures 5 mm and is located in the upper outer quadrant. It is ER/PR positive and HER2 negative with a Ki67 of 15%. She had a hysterectomy in 2002.  PATIENT GOALS:   reduce lymphedema risk and learn post op HEP.   PAIN:  Are you  having pain? No  PRECAUTIONS: Active CA   HAND DOMINANCE: right  WEIGHT BEARING RESTRICTIONS: No  FALLS:  Has patient fallen in last 6 months? 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  LEISURE: She does not exercise  PRIOR LEVEL OF FUNCTION: Independent   OBJECTIVE:  COGNITION: Overall cognitive status: Within functional limits for tasks assessed    POSTURE:  Forward head and rounded shoulders posture  UPPER EXTREMITY AROM/PROM:  A/PROM RIGHT   eval   Shoulder extension 47  Shoulder flexion 164  Shoulder abduction 165  Shoulder internal rotation 63  Shoulder external rotation 90    (Blank rows = not tested)  A/PROM LEFT   eval  Shoulder extension 50  Shoulder flexion 147  Shoulder abduction 178  Shoulder internal rotation 48  Shoulder external rotation 82    (Blank rows = not tested)  CERVICAL AROM: All within normal limits  UPPER EXTREMITY STRENGTH: WFL  LYMPHEDEMA ASSESSMENTS:   LANDMARK RIGHT   eval  10 cm proximal to olecranon process 29.3  Olecranon process 25.8  10 cm proximal to ulnar styloid process 22.7  Just proximal to ulnar styloid process 15.8  Across hand at thumb web space 18.1  At base of 2nd digit 6  (Blank rows = not tested)  LANDMARK LEFT   eval  10 cm proximal to olecranon process 28.8  Olecranon process 24.6  10 cm proximal to ulnar  styloid process 21.6  Just proximal to ulnar styloid process 15.5  Across hand at thumb web space 17.8  At base of 2nd digit 5.8  (Blank rows = not tested)  L-DEX LYMPHEDEMA SCREENING:  The patient was assessed using the L-Dex machine today to produce a lymphedema index baseline score. The patient will be reassessed on a regular basis (typically every 3 months) to obtain new L-Dex scores. If the score is > 6.5 points away from his/her baseline score indicating onset of subclinical lymphedema, it will be recommended to wear a compression garment for 4 weeks, 12 hours per day and then be reassessed. If the score continues to be > 6.5 points from baseline at reassessment, we will initiate lymphedema treatment. Assessing in this manner has a 95% rate of preventing clinically significant lymphedema.   L-DEX FLOWSHEETS - 11/05/22 1100       L-DEX LYMPHEDEMA SCREENING   Measurement Type Unilateral    L-DEX MEASUREMENT EXTREMITY Upper Extremity    POSITION  Standing    DOMINANT SIDE Right    At Risk Side Right    BASELINE SCORE (UNILATERAL) 2.4             QUICK DASH SURVEY:  Katina Dung - 11/05/22 0001     Open a tight or new jar No difficulty    Do heavy household chores (wash walls, wash floors) No difficulty    Carry a shopping bag or briefcase No difficulty    Wash your back No difficulty    Use a knife to cut food No difficulty    Recreational activities in which you take some force or impact through your arm, shoulder, or hand (golf, hammering, tennis) No difficulty    During the past week, to what extent has your arm, shoulder or hand problem interfered with your normal social activities with family, friends, neighbors, or groups? Not at all    During the past week,  to what extent has your arm, shoulder or hand problem limited your work or other regular daily activities Not at all    Arm, shoulder, or hand pain. None    Tingling (pins and needles) in your arm, shoulder, or hand  None    Difficulty Sleeping No difficulty    DASH Score 0 %              PATIENT EDUCATION:  Education details: Lymphedema risk reduction and post op shoulder/posture HEP Person educated: Patient Education method: Explanation, Demonstration, Handout Education comprehension: Patient verbalized understanding and returned demonstration  HOME EXERCISE PROGRAM: Patient was instructed today in a home exercise program today for post op shoulder range of motion. These included active assist shoulder flexion in sitting, scapular retraction, wall walking with shoulder abduction, and hands behind head external rotation.  She was encouraged to do these twice a day, holding 3 seconds and repeating 5 times when permitted by her physician.   ASSESSMENT:  CLINICAL IMPRESSION: Patient was diagnosed on 10/29/2022 with right grade 2 invasive ductal carcinoma breast cancer. It measures 5 mm and is located in the upper outer quadrant. It is ER/PR positive and HER2 negative with a Ki67 of 15%. She had a hysterectomy in 2002.Her multidisciplinary medical team met prior to her assessments to determine a recommended treatment plan. She is planning to have a right lumpectomy and sentinel node biopsy followed by possible Oncotype testing, radiation, and anti-estrogen therapy. She will benefit from a post op PT reassessment to determine needs and from L-Dex screens every 3 months for 2 years to detect subclinical lymphedema.  Pt will benefit from skilled therapeutic intervention to improve on the following deficits: Decreased knowledge of precautions, impaired UE functional use, pain, decreased ROM, postural dysfunction.   PT treatment/interventions: ADL/self-care home management, pt/family education, therapeutic exercise  REHAB POTENTIAL: Excellent  CLINICAL DECISION MAKING: Stable/uncomplicated  EVALUATION COMPLEXITY: Low   GOALS: Goals reviewed with patient? YES  LONG TERM GOALS: (STG=LTG)    Name  Target Date Goal status  1 Pt will be able to verbalize understanding of pertinent lymphedema risk reduction practices relevant to her dx specifically related to skin care.  Baseline:  No knowledge 11/05/2022 Achieved at eval  2 Pt will be able to return demo and/or verbalize understanding of the post op HEP related to regaining shoulder ROM. Baseline:  No knowledge 11/05/2022 Achieved at eval  3 Pt will be able to verbalize understanding of the importance of attending the post op After Breast CA Class for further lymphedema risk reduction education and therapeutic exercise.  Baseline:  No knowledge 11/05/2022 Achieved at eval  4 Pt will demo she has regained full shoulder ROM and function post operatively compared to baselines.  Baseline: See objective measurements taken today. 12/31/2022     PLAN:  PT FREQUENCY/DURATION: EVAL and 1 follow up appointment.   PLAN FOR NEXT SESSION: will reassess 3-4 weeks post op to determine needs.   Patient will follow up at outpatient cancer rehab 3-4 weeks following surgery.  If the patient requires physical therapy at that time, a specific plan will be dictated and sent to the referring physician for approval. The patient was educated today on appropriate basic range of motion exercises to begin post operatively and the importance of attending the After Breast Cancer class following surgery.  Patient was educated today on lymphedema risk reduction practices as it pertains to recommendations that will benefit the patient immediately following surgery.  She verbalized good understanding.  Physical Therapy Information for After Breast Cancer Surgery/Treatment:  Lymphedema is a swelling condition that you may be at risk for in your arm if you have lymph nodes removed from the armpit area.  After a sentinel node biopsy, the risk is approximately 5-9% and is higher after an axillary node dissection.  There is treatment available for this condition and it is not  life-threatening.  Contact your physician or physical therapist with concerns. You may begin the 4 shoulder/posture exercises (see additional sheet) when permitted by your physician (typically a week after surgery).  If you have drains, you may need to wait until those are removed before beginning range of motion exercises.  A general recommendation is to not lift your arms above shoulder height until drains are removed.  These exercises should be done to your tolerance and gently.  This is not a "no pain/no gain" type of recovery so listen to your body and stretch into the range of motion that you can tolerate, stopping if you have pain.  If you are having immediate reconstruction, ask your plastic surgeon about doing exercises as he or she may want you to wait. We encourage you to attend the free one time ABC (After Breast Cancer) class offered by [facility] [facility].  You will learn information related to lymphedema risk, prevention and treatment and additional exercises to regain mobility following surgery.  You can call [phone] for more information.  This is offered the 1st and 3rd Monday of each month.  You only attend the class one time. While undergoing any medical procedure or treatment, try to avoid blood pressure being taken or needle sticks from occurring on the arm on the side of cancer.   This recommendation begins after surgery and continues for the rest of your life.  This may help reduce your risk of getting lymphedema (swelling in your arm). An excellent resource for those seeking information on lymphedema is the National Lymphedema Network's web site. It can be accessed at [place].org If you notice swelling in your hand, arm or breast at any time following surgery (even if it is many years from now), please contact your doctor or physical therapist to discuss this.  Lymphedema can be treated at any time but it is easier for you if it is treated early on.  If you feel  like your shoulder motion is not returning to normal in a reasonable amount of time, please contact your surgeon or physical therapist.  [place] [phone]. [address], [address], [place]  [address]  ABC CLASS After Breast Cancer Class  After Breast Cancer Class is a specially designed exercise class to assist you in a safe recover after having breast cancer surgery.  In this class you will learn how to get back to full function whether your drains were just removed or if you had surgery a month ago.  This one-time class is held the 1st and 3rd Monday of every month from 11:00 a.m. until 12:00 noon virtually.  This class is FREE and space is limited. For more information or to register for the next available class, call [phone].  Class Goals  Understand specific stretches to improve the flexibility of you chest and shoulder. Learn ways to safely strengthen your upper body and improve your posture. Understand the warning signs of infection and why you may be at risk for an arm infection. Learn about Lymphedema and prevention.  ** You do not attend  this class until after surgery.  Drains must be removed to participate  Patient was instructed today in a home exercise program today for post op shoulder range of motion. These included active assist shoulder flexion in sitting, scapular retraction, wall walking with shoulder abduction, and hands behind head external rotation.  She was encouraged to do these twice a day, holding 3 seconds and repeating 5 times when permitted by her physician.   Annia Friendly, [place] 11/05/22 11:43 AM

## 2022-11-05 NOTE — Progress Notes (Signed)
EFERRING PROVIDER: Benay Pike, MD [place],  [place] [address]  PRIMARY PROVIDER:  Lawerance Cruel, MD  PRIMARY REASON FOR VISIT:  1. Invasive ductal carcinoma of right breast, stage 1 (HCC)   2. Family history of breast cancer   3. Family history of uterine cancer   4. Family history of melanoma     HISTORY OF PRESENT ILLNESS:   Ms. Menge, a 62 y.o. female, was seen for a [place] cancer genetics consultation during the breast multidisciplinary clinic at the request of Dr. Chryl Heck due to a personal and family history of breast cancer.  Ms. Yarish presents to clinic today to discuss the possibility of a hereditary predisposition to cancer, to discuss genetic testing, and to further clarify her future cancer risks, as well as potential cancer risks for family members.   In 2024, at the age of 80, Ms. Shreffler was diagnosed with invasive ductal carcinoma right breast (ER+/PR+/HER2-). The treatment plan is pending.   CANCER HISTORY:  Oncology History  Invasive ductal carcinoma of right breast, stage 1 ([place])  10/06/2022 Mammogram   Mammogram showed possible distortion in the right breast warranting further evaluation.  Diagnostic mammogram showed a 7 mm mass in the 12 o'clock position of the right breast with imaging features suspicious for malignancy including associated focal distortion.  Ultrasound done on February 26 confirmed the same findings.   10/27/2022 Pathology Results   Pathology from the right breast needle core biopsy showed invasive moderately differentiated ductal carcinoma, grade 2, DCIS intermediate nuclear grade, negative for angiolymphatic invasion negative for micro calcs, invasive tumor measuring 4 mm in greatest linear extent, ER 100% positive strong staining PR 30% positive strong staining Ki-67 15% and group 5 HER2 negative   11/05/2022 Initial Diagnosis   Invasive ductal carcinoma of right breast, stage 1 ([place])     Past Medical History:  Diagnosis  Date   Bursitis    COVID 05/2021   Endometriosis    Lichen sclerosus     Past Surgical History:  Procedure Laterality Date   ABDOMINAL HYSTERECTOMY  2002   TAH,BSO/ENDOMETRIOSIS   BREAST BIOPSY Right 10/27/2022   [place] RT BREAST BX W LOC DEV 1ST LESION IMG BX SPEC US GUIDE 10/27/2022 [facility]   '81 RIGHT AND '89 LEFT   cataract surgery     right eye   OOPHORECTOMY     BSO     FAMILY HISTORY:  We obtained a detailed, 4-generation family history.  Significant diagnoses are listed below: Family History  Problem Relation Age of Onset   Uterine cancer Sister        dx late 29s   Breast cancer Maternal Aunt        dx 70s-80s   Melanoma Maternal Aunt        d. ~49; mets    Ms. Gmerek is unaware of previous family history of genetic testing for hereditary cancer risks. There is no reported Ashkenazi Jewish ancestry. There is no known consanguinity.  GENETIC COUNSELING ASSESSMENT: Ms. Mancia is a 62 y.o. female with a personal and family history of cancer which is somewhat suggestive of a hereditary cancer syndrome and predisposition to cancer given the presence of related cancers in the family (breast, uterine, melanoma, etc.). We, therefore, discussed and recommended the following at today's visit.   DISCUSSION: We discussed that 5 - 10% of cancer is hereditary, with most cases of hereditary breast cancer associated with  mutations in BRCA1/2.  There are other genes that can be associated with hereditary breast, uterine, or melanoma cancer syndromes.  Type of cancer risk and level of risk are gene-specific. We discussed that testing is beneficial for several reasons including knowing how to follow individuals after completing their treatment, identifying whether potential treatment options would be beneficial, and understanding if other family members could be at risk for cancer and allowing them to undergo genetic testing.   We reviewed the  characteristics, features and inheritance patterns of hereditary cancer syndromes. We also discussed genetic testing, including the appropriate family members to test, the process of testing, insurance coverage and turn-around-time for results. We discussed the implications of a negative, positive and/or variant of uncertain significant result. In order to get genetic test results in a timely manner so that Ms. Menken can use these genetic test results for surgical decisions, we recommended Ms. Knope pursue genetic testing for the [facility] STAT Panel.  The STAT Breast cancer panel offered by [facility] includes sequencing and rearrangement analysis for the following 9 genes:  ATM, BRCA1, BRCA2, CDH1, CHEK2, PALB2, PTEN, STK11 and TP53.  Once complete, we recommend Ms. Erisman pursue reflex genetic testing to a more comprehensive gene panel.    The [facility] Common Hereditary Cancers + RNA Panel includes sequencing, deletion/duplication, and RNA analysis of the following 48 genes: APC, ATM, AXIN2, BAP1, BARD1, BMPR1A, BRCA1, BRCA2, BRIP1, CDH1, CDK4*, CDKN2A*, CHEK2, CTNNA1, DICER1, EPCAM* (del/dup only), FH, GREM1* (promoter dup analysis only), HOXB13*, KIT*, MBD4*, MEN1, MLH1, MSH2, MSH3, MSH6, MUTYH, NF1, NTHL1, PALB2, PDGFRA*, PMS2, POLD1, POLE, PTEN, RAD51C, RAD51D, SDHA (sequencing only), SDHB, SDHC, SDHD, SMAD4, SMARCA4, STK11, TP53, TSC1, TSC2, VHL.  *Genes without RNA analysis.   Sequencing and deletion/duplication analysis of melanoma genes MITF and POT1 were added to the [facility] panel (50 genes total).    We discussed that based on Ms. Taff's personal and family history of cancer, she meets not meet NCCN medical criteria for genetic testing.  However, it is reasonable to consider genetic testing given the presence of related cancers in the family (breast, uterine, melanoma).  She knows that there may be an out of pocket cost associated with testing. If her out of pocket cost for testing is over $100, the  laboratory should contact them to discuss self-pay prices, patient pay assistance programs, if applicable, and other billing options.  We discussed the lab's policy of [id number] self-pay price for patients with a history of breast cancer.   PLAN: After considering the risks, benefits, and limitations, Ms. Flicker provided informed consent to pursue genetic testing and the blood sample was sent to [facility] for analysis of the STAT and Common Hereditary Cancers +RNA +melanoma genes Panel. Results should be available within approximately 1-2 weeks' time, at which point they will be disclosed by telephone to Ms. Hulce, as will any additional recommendations warranted by these results. Ms. Remer will receive a summary of her genetic counseling visit and a copy of her results once available. This information will also be available in Epic.   Ms. Afifi questions were answered to her satisfaction today. Our contact information was provided should additional questions or concerns arise. Thank you for the referral and allowing [place] to share in the care of your patient.   Jariyah Hackley M. Joette Catching, Pepper Pike, [facility] Genetic Counselor [email or web address] (P) [phone]  The patient was seen for a total of 15 minutes in face-to-face genetic counseling.  The patient was accompanied by her husband, Rush Landmark. Dr. Chryl Heck  was available to discuss this case as needed.  _______________________________________________________________________ For Office Staff:  Number of people involved in session: 2 Was an Intern/ student involved with case: yes; [place] observed session

## 2022-11-05 NOTE — Progress Notes (Signed)
[facility] NOTE  Patient Care Team: Lawerance Cruel, MD as PCP - General (Family Medicine) Tamela Gammon, NP as Nurse Practitioner (Gynecology) Coralie Keens, MD as Consulting Physician (General Surgery) Eppie Gibson, MD as Attending Physician (Radiation Oncology) Benay Pike, MD as Consulting Physician (Hematology and Oncology) Mauro Kaufmann, RN as Oncology Nurse Navigator Rockwell Germany, RN as Oncology Nurse Navigator  CHIEF COMPLAINTS/PURPOSE OF CONSULTATION:  Newly diagnosed breast cancer  HISTORY OF PRESENTING ILLNESS:  Connie Alvarez 62 y.o. female is here because of recent diagnosis of right   I reviewed her records extensively and collaborated the history with the patient.  SUMMARY OF ONCOLOGIC HISTORY: Oncology History  Invasive ductal carcinoma of right breast, stage 1 ([place])  10/06/2022 Mammogram   Mammogram showed possible distortion in the right breast warranting further evaluation.  Diagnostic mammogram showed a 7 mm mass in the 12 o'clock position of the right breast with imaging features suspicious for malignancy including associated focal distortion.  Ultrasound done on February 26 confirmed the same findings.   10/27/2022 Pathology Results   Pathology from the right breast needle core biopsy showed invasive moderately differentiated ductal carcinoma, grade 2, DCIS intermediate nuclear grade, negative for angiolymphatic invasion negative for micro calcs, invasive tumor measuring 4 mm in greatest linear extent, ER 100% positive strong staining PR 30% positive strong staining Ki-67 15% and group 5 HER2 negative   11/05/2022 Initial Diagnosis   Invasive ductal carcinoma of right breast, stage 1 ([place])    Patient arrived to the appointment with Ms Carlos Levering. Ms Shylie is healthy at baseline, denies any new health complaints. She used hormone replacement therapy for about 10 years, and had total hysterectomy in 2002 because of  endometriosis.  Her main postmenopausal symptom was hot flashes. Rest of the ROS based on the records with no concerns.  MEDICAL HISTORY:  Past Medical History:  Diagnosis Date   Bursitis    COVID 05/2021   Endometriosis    Lichen sclerosus     SURGICAL HISTORY: Past Surgical History:  Procedure Laterality Date   ABDOMINAL HYSTERECTOMY  2002   TAH,BSO/ENDOMETRIOSIS   BREAST BIOPSY Right 10/27/2022   [place] RT BREAST BX W LOC DEV 1ST LESION IMG BX SPEC US GUIDE 10/27/2022 [facility]   '81 RIGHT AND '89 LEFT   cataract surgery     right eye   OOPHORECTOMY     BSO    SOCIAL HISTORY: Social History   Socioeconomic History   Marital status: Married    Spouse name: Not on file   Number of children: Not on file   Years of education: Not on file   Highest education level: Not on file  Occupational History   Not on file  Tobacco Use   Smoking status: Never   Smokeless tobacco: Never  Vaping Use   Vaping Use: Never used  Substance and Sexual Activity   Alcohol use: Yes    Alcohol/week: 0.0 standard drinks of alcohol    Comment: rare   Drug use: No   Sexual activity: Yes    Birth control/protection: Surgical    Comment: INTERCOURSE AGE 64, SEXUAL PARTNERS LESS THAN 5  Other Topics Concern   Not on file  Social History Narrative   Not on file   Social Determinants of Health   Financial Resource Strain: Not on file  Food Insecurity: Not on file  Transportation Needs: Not on file  Physical Activity: Not on file  Stress: Not on file  Social Connections: Not on file  Intimate Partner Violence: Not on file    FAMILY HISTORY: Family History  Problem Relation Age of Onset   Diabetes Mother    Hypertension Mother    Diabetes Sister    Uterine cancer Sister    Breast cancer Maternal Aunt    Melanoma Maternal Aunt     ALLERGIES:  has No Known Allergies.  MEDICATIONS:  Current Outpatient Medications  Medication Sig Dispense  Refill   clobetasol ointment (TEMOVATE) [id number] % Apply 1 Application topically 2 (two) times daily. 30 g 0   fluconazole (DIFLUCAN) 150 MG tablet Take 1 tablet (150 mg total) by mouth every 3 (three) days. 2 tablet 0   Ibuprofen (ADVIL PO) Take by mouth as needed.      Multiple Vitamin (MULTIVITAMIN) capsule Take 1 capsule by mouth daily.       VITAMIN D PO Take by mouth.     vitamin E 45 MG (100 UNITS) capsule 1 capsule     No current facility-administered medications for this visit.    REVIEW OF SYSTEMS:   Constitutional: Denies fevers, chills or abnormal night sweats Eyes: Denies blurriness of vision, double vision or watery eyes Ears, nose, mouth, throat, and face: Denies mucositis or sore throat Respiratory: Denies cough, dyspnea or wheezes Cardiovascular: Denies palpitation, chest discomfort or lower extremity swelling Gastrointestinal:  Denies nausea, heartburn or change in bowel habits Skin: Denies abnormal skin rashes Lymphatics: Denies new lymphadenopathy or easy bruising Neurological:Denies numbness, tingling or new weaknesses Behavioral/Psych: Mood is stable, no new changes  Breast: Denies any palpable lumps or discharge All other systems were reviewed with the patient and are negative.  PHYSICAL EXAMINATION: ECOG PERFORMANCE STATUS: 0 - Asymptomatic  Vitals:   11/05/22 0905  BP: 131/62  Pulse: 73  Resp: 16  Temp: 97.8 F (36.6 C)  SpO2: 100%   Filed Weights   11/05/22 0905  Weight: 165 lb 3.2 oz (74.9 kg)    GENERAL:alert, no distress and comfortable Right breast with postbiopsy changes.  No palpable mass.  Left breast normal to inspection and palpation.  No regional adenopathy.  LABORATORY DATA:  I have reviewed the data as listed Lab Results  Component Value Date   WBC 5.7 04/11/2021   HGB 13.4 04/11/2021   HCT 39.9 04/11/2021   MCV 86.2 04/11/2021   PLT 279 04/11/2021   Lab Results  Component Value Date   NA 139 04/11/2021   K 4.0 04/11/2021    CL 106 04/11/2021   CO2 25 04/11/2021    RADIOGRAPHIC STUDIES: I have personally reviewed the radiological reports and agreed with the findings in the report.  ASSESSMENT AND PLAN:  Invasive ductal carcinoma of right breast, stage 1 (HCC) This is a very pleasant 62 year old postmenopausal female patient with newly diagnosed right breast invasive ductal carcinoma, grade 2 ER/PR positive HER2 negative staged as T1b N0 MX referred to breast [place] for additional recommendations.  Given very small tumor and strong ER/PR positivity, she will proceed with upfront surgery, lumpectomy followed by Oncotype testing.  We have discussed the following details about Oncotype.  We have discussed about Oncotype Dx score which is a well validated prognostic scoring system which can predict outcome with endocrine therapy alone and whether chemotherapy reduces recurrence.  Typically in patients with ER positive cancers that are node negative if the RS score is high typically greater than or equal to 26, chemotherapy  is recommended.  In women with intermediate recurrence score younger than 41, there can still be some role for chemotherapy in addition to endocrine therapy especially if the recurrence score is between 21-25. If chemotherapy is needed, this will precede radiation and then after radiation she will continue on antiestrogen therapy.  If Oncotype deems that she is not a candidate for adjuvant chemotherapy, she will then proceed with adjuvant radiation followed by antiestrogen therapy.  We have discussed the following details about antiestrogen therapy.  We have discussed options for antiestrogen therapy today  With regards to Tamoxifen, we discussed that this is a SERM, selective estrogen receptor modulator. We discussed mechanism of action of Tamoxifen, adverse effects on Tamoxifen including but not limited to post menopausal symptoms, increased risk of DVT/PE, increased risk of endometrial cancer,  questionable cataracts with long term use and increased risk of cardiovascular events in the study which was not statistically significant. A benefit from Tamoxifen would be improvement in bone density. With regards to aromatase inhibitors, we discussed mechanism of action, adverse effects including but not limited to post menopausal symptoms, arthralgias, myalgias, increased risk of cardiovascular events and bone loss.  At this time she is leaning towards aromatase inhibitors.  She will return to clinic after surgery to review Oncotype results and to discuss any additional recommendations.  All her questions were answered to the best my knowledge.  Thank you for consulting [place] in the care of this patient.  Please do not hesitate to contact us with any additional questions or concerns.    Total time spent: 45 minutes including breast MDC discussion, history, physical exam, review of records, counseling and coordination of care  All questions were answered. The patient knows to call the clinic with any problems, questions or concerns.    Benay Pike, MD 11/05/22

## 2022-11-10 ENCOUNTER — Other Ambulatory Visit: Payer: Self-pay | Admitting: Surgery

## 2022-11-10 DIAGNOSIS — Z853 Personal history of malignant neoplasm of breast: Secondary | ICD-10-CM

## 2022-11-11 ENCOUNTER — Telehealth: Payer: Self-pay | Admitting: Radiology

## 2022-11-11 ENCOUNTER — Other Ambulatory Visit: Payer: Self-pay

## 2022-11-11 DIAGNOSIS — C50911 Malignant neoplasm of unspecified site of right female breast: Secondary | ICD-10-CM

## 2022-11-11 NOTE — Telephone Encounter (Signed)
Per 3/19 IB reached out to patient to schedule, patient aware of date and time of appointment.

## 2022-11-12 ENCOUNTER — Inpatient Hospital Stay: Payer: BC Managed Care – PPO

## 2022-11-12 ENCOUNTER — Encounter: Payer: Self-pay | Admitting: *Deleted

## 2022-11-12 ENCOUNTER — Telehealth: Payer: Self-pay | Admitting: *Deleted

## 2022-11-12 ENCOUNTER — Inpatient Hospital Stay: Payer: BC Managed Care – PPO | Admitting: Radiology

## 2022-11-12 DIAGNOSIS — C50911 Malignant neoplasm of unspecified site of right female breast: Secondary | ICD-10-CM

## 2022-11-12 LAB — RESEARCH LABS

## 2022-11-12 NOTE — Research (Signed)
[employer] 2021-05 - Specimen Collection Study to Evaluate Biomarkers in Subjects with Cancer   11/12/2022  ELIGIBILITY:  This Coordinator has reviewed this patient's inclusion and exclusion criteria and confirmed Connie Alvarez is eligible for study participation.  Patient will continue with enrollment.  Menopausal status (women only): Connie Alvarez is post-menopausal w/ history of hysterectomy.  Eligibility confirmed by treating investigator, who also agrees that patient should proceed with enrollment.   CONSENT:  Patient Connie Alvarez was identified by Dr. Chryl Heck as a potential candidate for the above listed study.  This Clinical Research Coordinator met with Connie Alvarez, [id number] on 11/12/22 in a manner and location that ensures patient privacy to discuss participation in the above listed research study.  Patient is Unaccompanied.  Patient was previously provided with informed consent documents.  Patient confirmed they have read the informed consent documents.  As outlined in the informed consent form, this Coordinator and Phuong Selvaggio discussed the purpose of the research study, the investigational nature of the study, study procedures and requirements for study participation, potential risks and benefits of study participation, as well as alternatives to participation.  This study is not blinded or double-blinded. The patient understands participation is voluntary and they may withdraw from study participation at any time.  This study does not involve randomization.  This study does not involve an investigational drug or device. This study does not involve a placebo. Patient understands enrollment is pending full eligibility review.   Confidentiality and how the patient's information will be used as part of study participation were discussed.  Patient was informed there is reimbursement provided for their time and effort spent on trial participation.  The patient is  encouraged to discuss research study participation with their insurance provider to determine what costs they may incur as part of study participation, including research related injury.    All questions were answered to patient's satisfaction.  The informed consent with embedded HIPAA language was reviewed page by page.  The patient's mental and emotional status is appropriate to provide informed consent, and the patient verbalizes an understanding of study participation.  Patient has agreed to participate in the above listed research study and has voluntarily signed the informed consent version 07 Sep 2020 with embedded HIPAA language, version 07 Sep 2020  on 11/12/22 at 0853AM.  The patient was provided with a copy of the signed informed consent form with embedded HIPAA language for their reference.  No study specific procedures were obtained prior to the signing of the informed consent document.  Approximately 25 minutes were spent with the patient reviewing the informed consent documents.  Patient was not requested to complete a Release of Information form.  After completion of consent documentation, medical history was obtained as follows:    Medical History:  High Blood Pressure  No Coronary Artery Disease No Lupus    No Rheumatoid Arthritis  No Diabetes   No      Lynch Syndrome  No  Is the patient currently taking a magnesium supplement?   No  Does the patient have a personal history of cancer (greater than 5 years ago)?  No  Does the patient have a family history of cancer in 1st or 2nd degree relatives? Yes If yes, Relationship(s) and Cancer type(s)?  Mom: carcinoma (unknown type) Sister: uterine 2x Aunt:  Breast  Does the patient have history of alcohol consumption? Yes   If yes, current or former? current Number of years? 46 yrs (  per patient, since 62 yrs old) Drinks per week? 1 per month  Does the patient have history of cigarette, cigar, pipe, or chewing tobacco use?  No    After completion of medical history, patient was escorted to the lab by Farris Has, Naval architect.  Blood Collection: Research blood obtained by [facility] venipuncture. Patient tolerated well without any adverse events. Gift Card: $50 gift card given to patient for her participation in this study.    Patient was thanked for her time and participation in the above mentioned study. Patient was encouraged to contact the research department for any future needs.   Carol Ada, RT(R)(T) Clinical Research Coordinator

## 2022-11-12 NOTE — Telephone Encounter (Signed)
Spoke with patient to follow up from [facility] 3/13 and assess navigation needs. Patient denies any questions or concerns at this time. Encouraged her to call should anything arise. Patient verbalized understanding.

## 2022-11-12 NOTE — Research (Signed)
[employer] 2021-05 - Specimen Collection Study to Evaluate Biomarkers in Subjects with Cancer   This Nurse has reviewed this patient's inclusion and exclusion criteria as a second review and confirms Connie Alvarez is eligible for study participation.  Patient may continue with enrollment.  Marjie Skiff Ailana Cuadrado, RN, BSN, [facility] She  Her  [facility] Clinical Research Nurse [place] [phone]  Pager [phone] 11/12/2022 8:31 AM

## 2022-11-13 ENCOUNTER — Encounter: Payer: Self-pay | Admitting: Genetic Counselor

## 2022-11-13 ENCOUNTER — Telehealth: Payer: Self-pay | Admitting: Genetic Counselor

## 2022-11-13 ENCOUNTER — Ambulatory Visit: Payer: Self-pay | Admitting: Genetic Counselor

## 2022-11-13 DIAGNOSIS — Z8049 Family history of malignant neoplasm of other genital organs: Secondary | ICD-10-CM

## 2022-11-13 DIAGNOSIS — Z1379 Encounter for other screening for genetic and chromosomal anomalies: Secondary | ICD-10-CM

## 2022-11-13 DIAGNOSIS — Z808 Family history of malignant neoplasm of other organs or systems: Secondary | ICD-10-CM

## 2022-11-13 DIAGNOSIS — C50911 Malignant neoplasm of unspecified site of right female breast: Secondary | ICD-10-CM

## 2022-11-13 DIAGNOSIS — Z803 Family history of malignant neoplasm of breast: Secondary | ICD-10-CM

## 2022-11-13 NOTE — Progress Notes (Signed)
HPI:   [name] was previously seen in the [place] clinic due to a personal history of breast cancer and concerns regarding a hereditary predisposition to cancer. Please refer to our prior cancer genetics clinic note for more information regarding our discussion, assessment and recommendations, at the time. [name] recent genetic test results were disclosed to her, as were recommendations warranted by these results. These results and recommendations are discussed in more detail below.  CANCER HISTORY:  Oncology History  Invasive ductal carcinoma of right breast, stage 1 ([place])  10/06/2022 Mammogram   Mammogram showed possible distortion in the right breast warranting further evaluation.  Diagnostic mammogram showed a 7 mm mass in the 12 o'clock position of the right breast with imaging features suspicious for malignancy including associated focal distortion.  Ultrasound done on February 26 confirmed the same findings.   10/27/2022 Pathology Results   Pathology from the right breast needle core biopsy showed invasive moderately differentiated ductal carcinoma, grade 2, DCIS intermediate nuclear grade, negative for angiolymphatic invasion negative for micro calcs, invasive tumor measuring 4 mm in greatest linear extent, ER 100% positive strong staining PR 30% positive strong staining Ki-67 15% and group 5 HER2 negative   11/05/2022 Initial Diagnosis   Invasive ductal carcinoma of right breast, stage 1 ([place])   11/11/2022 Genetic Testing   Negative [facility] Common Hereditary Cancers +RNA + MITF +POT1.  VUS in PDGFRA at c.2897A>G [facility]).  Report date is 11/11/2022.    The [facility] Common Hereditary Cancers + RNA +MITF + POT1 Panel includes sequencing, deletion/duplication, and RNA analysis of the following 48 genes: APC, ATM, AXIN2, BAP1, BARD1, BMPR1A, BRCA1, BRCA2, BRIP1, CDH1, CDK4*, CDKN2A*, CHEK2, CTNNA1, DICER1, EPCAM* (del/dup only), FH, GREM1* (promoter dup analysis  only), HOXB13*, KIT*, MBD4*, MITF, MEN1, MLH1, MSH2, MSH3, MSH6, MUTYH, NF1, NTHL1, PALB2, PDGFRA*, PMS2, POLD1, POLE, POT1, PTEN, RAD51C, RAD51D, SDHA (sequencing only), SDHB, SDHC, SDHD, SMAD4, SMARCA4, STK11, TP53, TSC1, TSC2, VHL.  *Genes without RNA analysis.              FAMILY HISTORY:  We obtained a detailed, 4-generation family history.  Significant diagnoses are listed below:      Family History  Problem Relation Age of Onset   Uterine cancer Sister          dx late 60s   Breast cancer Maternal Aunt          dx 70s-80s   Melanoma Maternal Aunt          d. ~56; mets     [name] is unaware of previous family history of genetic testing for hereditary cancer risks. There is no reported Ashkenazi Jewish ancestry. There is no known consanguinity.    GENETIC TEST RESULTS:  The [facility] Common Hereditary Cancers +RNA + MITF +POT1 Panel found no pathogenic mutations.   The [facility] Common Hereditary Cancers + RNA +MITF + POT1 Panel includes sequencing, deletion/duplication, and RNA analysis of the following 48 genes: APC, ATM, AXIN2, BAP1, BARD1, BMPR1A, BRCA1, BRCA2, BRIP1, CDH1, CDK4*, CDKN2A*, CHEK2, CTNNA1, DICER1, EPCAM* (del/dup only), FH, GREM1* (promoter dup analysis only), HOXB13*, KIT*, MBD4*, MITF, MEN1, MLH1, MSH2, MSH3, MSH6, MUTYH, NF1, NTHL1, PALB2, PDGFRA*, PMS2, POLD1, POLE, POT1, PTEN, RAD51C, RAD51D, SDHA (sequencing only), SDHB, SDHC, SDHD, SMAD4, SMARCA4, STK11, TP53, TSC1, TSC2, VHL.  *Genes without RNA analysis.   The test report has been scanned into EPIC and is located under the Molecular Pathology section of the Results Review tab.  A portion  of the result report is included below for reference. Genetic testing reported out on November 11, 2022.     Genetic testing identified a variant of uncertain significance (VUS) in the PDGFRA gene called c.2897A>G (p.His966Arg).  At this time, it is unknown if this variant is associated with an increased risk for cancer  or if it is benign, but most uncertain variants are reclassified to benign. It should not be used to make medical management decisions. With time, we suspect the laboratory will determine the significance of this variant, if any. If the laboratory reclassifies this variant, we will attempt to contact [name] to discuss it further.   Even though a pathogenic variant was not identified, possible explanations for the cancer in the family may include: There may be no hereditary risk for cancer in the family. The cancers in [name] and/or her family may be sporadic/familial or due to other genetic and environmental factors. There may be a gene mutation in one of these genes that current testing methods cannot detect but that chance is small. There could be another gene that has not yet been discovered, or that we have not yet tested, that is responsible for the cancer diagnoses in the family.  It is also possible there is a hereditary cause for the cancer in the family that [name] did not inherit.   Therefore, it is important to remain in touch with cancer genetics in the future so that we can continue to offer [name] the most up to date genetic testing.    ADDITIONAL GENETIC TESTING:  We discussed with [name] that her genetic testing was fairly extensive.  If there are additional relevant genes identified to increase cancer risk that can be analyzed in the future, we would be happy to discuss and coordinate this testing at that time.      CANCER SCREENING RECOMMENDATIONS:  [name] test result is considered negative (normal).  This means that we have not identified a hereditary cause for her personal history of breast cancer at this time.   An individual's cancer risk and medical management are not determined by genetic test results alone. Overall cancer risk assessment incorporates additional factors, including personal medical history, family history, and any available genetic  information that may result in a personalized plan for cancer prevention and surveillance. Therefore, it is recommended she continue to follow the cancer management and screening guidelines provided by her oncology and primary healthcare provider.  RECOMMENDATIONS FOR FAMILY MEMBERS:   Since she did not inherit a identifiable mutation in a cancer predisposition gene included on this panel, her children could not have inherited a known mutation from her in one of these genes. Individuals in this family might be at some increased risk of developing cancer, over the general population risk, due to the family history of cancer.  Individuals in the family should notify their providers of the family history of cancer. We recommend women in this family have a yearly mammogram beginning at age 74, or 58 years younger than the earliest onset of cancer, an annual clinical breast exam, and perform monthly breast self-exams.   We do not recommend familial testing for the PDGFRA variant of uncertain significance (VUS).  FOLLOW-UP:  Lastly, we discussed with [name] that cancer genetics is a rapidly advancing field and it is possible that new genetic tests will be appropriate for her and/or her family members in the future. We encouraged her to remain in contact with cancer  genetics on an annual basis so we can update her personal and family histories and let her know of advances in cancer genetics that may benefit this family.   Our contact number was provided. [name] questions were answered to her satisfaction, and she knows she is welcome to call us at anytime with additional questions or concerns.   Elanie Hammitt M. Joette Catching, [place], [facility] Genetic Counselor [email or web address] (P) [phone]

## 2022-11-13 NOTE — Telephone Encounter (Signed)
Disclosed negative genetics and VUS in PDGFRA.

## 2022-11-18 DIAGNOSIS — C50911 Malignant neoplasm of unspecified site of right female breast: Secondary | ICD-10-CM | POA: Diagnosis not present

## 2022-11-19 ENCOUNTER — Other Ambulatory Visit: Payer: Self-pay

## 2022-11-19 ENCOUNTER — Encounter (HOSPITAL_BASED_OUTPATIENT_CLINIC_OR_DEPARTMENT_OTHER): Payer: Self-pay | Admitting: Surgery

## 2022-11-24 ENCOUNTER — Telehealth: Payer: Self-pay

## 2022-11-24 NOTE — Telephone Encounter (Signed)
Patient called. Diagnosed with breast cancer and scheduled for surgery/lumpectomy this Thursday.  She is c/o a yeast infection. Said she used Monistat but no relief. Her sx are really bad vaginal itching and cloudy urine. No vag discharge and no odor.  She said she has had two really bad yeast infections in the last 15 mos and tried monistat both times but ultimately had to be treated with Diflucan.  I did explain that we recommend OV to diagnose. She asked if you might prescribe for her versus OV because of her time restraints.  Pharmacy is set.

## 2022-11-25 ENCOUNTER — Other Ambulatory Visit: Payer: Self-pay | Admitting: Nurse Practitioner

## 2022-11-25 DIAGNOSIS — B3731 Acute candidiasis of vulva and vagina: Secondary | ICD-10-CM

## 2022-11-25 MED ORDER — FLUCONAZOLE 150 MG PO TABS: 150.0000 mg | ORAL_TABLET | ORAL | 0 refills | Status: DC

## 2022-11-25 NOTE — Telephone Encounter (Signed)
Spoke with patient and informed her. °

## 2022-11-25 NOTE — Telephone Encounter (Signed)
Please let her know I am sorry to hear about her breast cancer diagnosis and I am thinking about her. I will send in Diflucan for current yeast infection. Of course if no improvement an OV is recommended.

## 2022-11-26 ENCOUNTER — Telehealth: Payer: Self-pay | Admitting: Hematology and Oncology

## 2022-11-26 ENCOUNTER — Ambulatory Visit
Admission: RE | Admit: 2022-11-26 | Discharge: 2022-11-26 | Disposition: A | Discharge status: Home / Self Care | Payer: BC Managed Care – PPO | Source: Ambulatory Visit | Attending: Surgery | Admitting: Surgery

## 2022-11-26 DIAGNOSIS — C50911 Malignant neoplasm of unspecified site of right female breast: Secondary | ICD-10-CM | POA: Diagnosis not present

## 2022-11-26 DIAGNOSIS — Z853 Personal history of malignant neoplasm of breast: Secondary | ICD-10-CM

## 2022-11-26 HISTORY — PX: BREAST BIOPSY: SHX20

## 2022-11-26 NOTE — H&P (Signed)
   REFERRING PHYSICIAN: Mel Almond* PROVIDER: Beverlee Nims, MD MRN: [id number] DOB: 03/14/1961 DATE OF ENCOUNTER: 11/05/2022 Subjective  Chief Complaint: Breast Cancer  History of Present Illness: Connie Alvarez is a 62 y.o. female who is seen today as an office consultation for evaluation of Breast Cancer  This is a pleasant 62 year old female who was found on recent screening mammography to have a small distortion in her right breast. She underwent further mammograms and ultrasound showing a 5 mm mass. There was posterior acoustic shadowing up to 7 mm. Ultrasound the axilla was unremarkable. She reports having had a previous biopsy of her left breast many years ago which was benign. She has had no other problems with her breast. She denies nipple discharge. She has a family history of breast cancer in a maternal aunt which she suspected was later in life. She is otherwise healthy without complaints. She has no cardiopulmonary issues and no problems with general anesthesia or surgery  Review of Systems: A complete review of systems was obtained from the patient. I have reviewed this information and discussed as appropriate with the patient. See HPI as well for other ROS.  ROS  Medical History: Past Medical History: Diagnosis Date History of cancer  Patient Active Problem List Diagnosis History of total abdominal hysterectomy and bilateral salpingo-oophorectomy  Past Surgical History: Procedure Laterality Date HYSTERECTOMY N/A 2002 OOPHORECTOMY Date Unknown   No Known Allergies  Current Outpatient Medications on File Prior to Visit Medication Sig Dispense Refill clobetasoL (TEMOVATE) 0.05 % ointment Apply 1 Application topically 2 (two) times daily PRN  No current facility-administered medications on file prior to visit.  Family History Problem Relation Age of Onset High blood pressure (Hypertension) Mother Hyperlipidemia (Elevated cholesterol)  Mother Diabetes Mother Stroke Father Diabetes Sister   Social History  Tobacco Use Smoking Status Never Smokeless Tobacco Never   Social History  Socioeconomic History Marital status: Married Tobacco Use Smoking status: Never Smokeless tobacco: Never Vaping Use Vaping Use: Never used Substance and Sexual Activity Alcohol use: Yes Comment: occasionallly Drug use: Never  Objective: There were no vitals filed for this visit. There is no height or weight on file to calculate BMI.  Physical Exam  She appears well on exam  There is mild ecchymosis from the biopsy but no palpable masses in the right breast. The nipple areolar complex is normal. There is no axillary adenopathy  Labs, Imaging and Diagnostic Testing: I have reviewed her mammograms, ultrasound, and pathology results  Assessment and Plan:  Diagnoses and all orders for this visit:  Invasive ductal carcinoma of breast, female, right (CMS-HCC)   I discussed the diagnosis with the patient and her husband. We have also discussed her at the multidisciplinary breast cancer conference. From a surgical standpoint we discussed options of breast conservation with lumpectomy and sentinel lymph node biopsy versus mastectomy. She wished to proceed with breast conservation. I next discussed proceeding with a radioactive seed guided right breast lumpectomy and right axillary sentinel lymph node biopsy. I explained the surgical procedure in detail. We discussed the risks which includes but is not limited to bleeding, infection, seroma formation, entry to surrounding structures, chronic edema, the need for further surgery if margins or lymph nodes are positive, cardiopulmonary issues with general anesthesia, postoperative recovery, they understand and wish to proceed with surgery which will be scheduled

## 2022-11-26 NOTE — Telephone Encounter (Signed)
Spoke with patient confirming upcoming appointments  

## 2022-11-27 ENCOUNTER — Ambulatory Visit
Admission: RE | Admit: 2022-11-27 | Discharge: 2022-11-27 | Disposition: A | Discharge status: Home / Self Care | Payer: BC Managed Care – PPO | Source: Ambulatory Visit | Attending: Surgery | Admitting: Surgery

## 2022-11-27 ENCOUNTER — Ambulatory Visit (HOSPITAL_BASED_OUTPATIENT_CLINIC_OR_DEPARTMENT_OTHER)
Admission: RE | Admit: 2022-11-27 | Discharge: 2022-11-27 | Disposition: A | Discharge status: Home / Self Care | Payer: BC Managed Care – PPO | Attending: Surgery | Admitting: Surgery

## 2022-11-27 ENCOUNTER — Encounter (HOSPITAL_BASED_OUTPATIENT_CLINIC_OR_DEPARTMENT_OTHER)
Admission: RE | Disposition: A | Discharge status: Home / Self Care | Payer: Self-pay | Source: Home / Self Care | Attending: Surgery

## 2022-11-27 ENCOUNTER — Other Ambulatory Visit: Payer: Self-pay

## 2022-11-27 ENCOUNTER — Ambulatory Visit (HOSPITAL_BASED_OUTPATIENT_CLINIC_OR_DEPARTMENT_OTHER): Payer: BC Managed Care – PPO | Admitting: Certified Registered Nurse Anesthetist

## 2022-11-27 ENCOUNTER — Encounter (HOSPITAL_BASED_OUTPATIENT_CLINIC_OR_DEPARTMENT_OTHER): Payer: Self-pay | Admitting: Surgery

## 2022-11-27 DIAGNOSIS — C50911 Malignant neoplasm of unspecified site of right female breast: Secondary | ICD-10-CM | POA: Diagnosis not present

## 2022-11-27 DIAGNOSIS — G8918 Other acute postprocedural pain: Secondary | ICD-10-CM | POA: Diagnosis not present

## 2022-11-27 DIAGNOSIS — Z170 Estrogen receptor positive status [ER+]: Secondary | ICD-10-CM | POA: Insufficient documentation

## 2022-11-27 DIAGNOSIS — R928 Other abnormal and inconclusive findings on diagnostic imaging of breast: Secondary | ICD-10-CM | POA: Diagnosis not present

## 2022-11-27 DIAGNOSIS — Z853 Personal history of malignant neoplasm of breast: Secondary | ICD-10-CM

## 2022-11-27 DIAGNOSIS — Z803 Family history of malignant neoplasm of breast: Secondary | ICD-10-CM | POA: Insufficient documentation

## 2022-11-27 HISTORY — PX: BREAST LUMPECTOMY: SHX2

## 2022-11-27 HISTORY — DX: Other complications of anesthesia, initial encounter: T88.59XA

## 2022-11-27 HISTORY — PX: BREAST LUMPECTOMY WITH RADIOACTIVE SEED AND SENTINEL LYMPH NODE BIOPSY: SHX6550

## 2022-11-27 SURGERY — BREAST LUMPECTOMY WITH RADIOACTIVE SEED AND SENTINEL LYMPH NODE BIOPSY
Anesthesia: General | Site: Chest | Laterality: Right

## 2022-11-27 MED ORDER — MIDAZOLAM HCL 2 MG/2ML IJ SOLN
INTRAMUSCULAR | Status: AC
  Filled 2022-11-27: qty 2

## 2022-11-27 MED ORDER — EPHEDRINE SULFATE-NACL 50-0.9 MG/10ML-% IV SOSY
PREFILLED_SYRINGE | INTRAVENOUS | Status: DC | PRN
  Administered 2022-11-27: 10 mg via INTRAVENOUS
  Administered 2022-11-27: 5 mg via INTRAVENOUS
  Administered 2022-11-27: 10 mg via INTRAVENOUS

## 2022-11-27 MED ORDER — BUPIVACAINE-EPINEPHRINE (PF) 0.5% -1:200000 IJ SOLN
INTRAMUSCULAR | Status: AC
  Filled 2022-11-27: qty 30

## 2022-11-27 MED ORDER — OXYCODONE HCL 5 MG PO TABS: 5.0000 mg | ORAL_TABLET | Freq: Once | ORAL | Status: DC | PRN

## 2022-11-27 MED ORDER — BUPIVACAINE LIPOSOME 1.3 % IJ SUSP
INTRAMUSCULAR | Status: DC | PRN
  Administered 2022-11-27: 10 mL via PERINEURAL

## 2022-11-27 MED ORDER — BUPIVACAINE LIPOSOME 1.3 % IJ SUSP
INTRAMUSCULAR | Status: AC
  Filled 2022-11-27: qty 10

## 2022-11-27 MED ORDER — ONDANSETRON HCL 4 MG/2ML IJ SOLN
INTRAMUSCULAR | Status: AC
  Filled 2022-11-27: qty 2

## 2022-11-27 MED ORDER — PROPOFOL 10 MG/ML IV BOLUS
INTRAVENOUS | Status: DC | PRN
  Administered 2022-11-27: 160 mg via INTRAVENOUS

## 2022-11-27 MED ORDER — EPHEDRINE 5 MG/ML INJ
INTRAVENOUS | Status: AC
  Filled 2022-11-27: qty 10

## 2022-11-27 MED ORDER — ACETAMINOPHEN 500 MG PO TABS
1000.0000 mg | ORAL_TABLET | ORAL | Status: AC
  Administered 2022-11-27: 1000 mg via ORAL

## 2022-11-27 MED ORDER — PROPOFOL 10 MG/ML IV BOLUS
INTRAVENOUS | Status: AC
  Filled 2022-11-27: qty 20

## 2022-11-27 MED ORDER — PHENYLEPHRINE 80 MCG/ML (10ML) SYRINGE FOR IV PUSH (FOR BLOOD PRESSURE SUPPORT)
PREFILLED_SYRINGE | INTRAVENOUS | Status: DC | PRN
  Administered 2022-11-27 (×4): 80 ug via INTRAVENOUS

## 2022-11-27 MED ORDER — BUPIVACAINE HCL (PF) 0.5 % IJ SOLN
INTRAMUSCULAR | Status: DC | PRN
  Administered 2022-11-27: 20 mL via PERINEURAL

## 2022-11-27 MED ORDER — HYDROMORPHONE HCL 1 MG/ML IJ SOLN: 0.2500 mg | INTRAMUSCULAR | Status: DC | PRN

## 2022-11-27 MED ORDER — MAGTRACE LYMPHATIC TRACER
INTRAMUSCULAR | Status: DC | PRN
  Administered 2022-11-27: 2 mL via INTRAMUSCULAR

## 2022-11-27 MED ORDER — TRAMADOL HCL 50 MG PO TABS: 50.0000 mg | ORAL_TABLET | Freq: Four times a day (QID) | ORAL | 0 refills | Status: DC | PRN

## 2022-11-27 MED ORDER — ACETAMINOPHEN 500 MG PO TABS
ORAL_TABLET | ORAL | Status: AC
  Filled 2022-11-27: qty 2

## 2022-11-27 MED ORDER — ENSURE PRE-SURGERY PO LIQD: 296.0000 mL | Freq: Once | ORAL | Status: DC

## 2022-11-27 MED ORDER — DEXAMETHASONE SODIUM PHOSPHATE 10 MG/ML IJ SOLN
INTRAMUSCULAR | Status: AC
  Filled 2022-11-27: qty 1

## 2022-11-27 MED ORDER — LACTATED RINGERS IV SOLN: INTRAVENOUS | Status: DC

## 2022-11-27 MED ORDER — CHLORHEXIDINE GLUCONATE CLOTH 2 % EX PADS: 6.0000 | MEDICATED_PAD | Freq: Once | CUTANEOUS | Status: DC

## 2022-11-27 MED ORDER — FENTANYL CITRATE (PF) 100 MCG/2ML IJ SOLN
INTRAMUSCULAR | Status: AC
  Filled 2022-11-27: qty 2

## 2022-11-27 MED ORDER — LIDOCAINE 2% (20 MG/ML) 5 ML SYRINGE
INTRAMUSCULAR | Status: DC | PRN
  Administered 2022-11-27: 60 mg via INTRAVENOUS

## 2022-11-27 MED ORDER — CEFAZOLIN SODIUM-DEXTROSE 2-4 GM/100ML-% IV SOLN
2.0000 g | INTRAVENOUS | Status: AC
  Administered 2022-11-27: 2 g via INTRAVENOUS

## 2022-11-27 MED ORDER — MIDAZOLAM HCL 2 MG/2ML IJ SOLN
2.0000 mg | Freq: Once | INTRAMUSCULAR | Status: AC
  Administered 2022-11-27: 1 mg via INTRAVENOUS

## 2022-11-27 MED ORDER — BUPIVACAINE-EPINEPHRINE 0.5% -1:200000 IJ SOLN
INTRAMUSCULAR | Status: DC | PRN
  Administered 2022-11-27: 14 mL

## 2022-11-27 MED ORDER — DEXAMETHASONE SODIUM PHOSPHATE 10 MG/ML IJ SOLN
INTRAMUSCULAR | Status: DC | PRN
  Administered 2022-11-27: 4 mg via INTRAVENOUS

## 2022-11-27 MED ORDER — LIDOCAINE 2% (20 MG/ML) 5 ML SYRINGE
INTRAMUSCULAR | Status: AC
  Filled 2022-11-27: qty 5

## 2022-11-27 MED ORDER — ONDANSETRON HCL 4 MG/2ML IJ SOLN
INTRAMUSCULAR | Status: DC | PRN
  Administered 2022-11-27: 4 mg via INTRAVENOUS

## 2022-11-27 MED ORDER — FENTANYL CITRATE (PF) 100 MCG/2ML IJ SOLN
100.0000 ug | Freq: Once | INTRAMUSCULAR | Status: AC
  Administered 2022-11-27: 50 ug via INTRAVENOUS

## 2022-11-27 MED ORDER — EPHEDRINE 5 MG/ML INJ
INTRAVENOUS | Status: AC
  Filled 2022-11-27: qty 5

## 2022-11-27 MED ORDER — FENTANYL CITRATE (PF) 250 MCG/5ML IJ SOLN
INTRAMUSCULAR | Status: DC | PRN
  Administered 2022-11-27: 25 ug via INTRAVENOUS

## 2022-11-27 MED ORDER — OXYCODONE HCL 5 MG/5ML PO SOLN: 5.0000 mg | Freq: Once | ORAL | Status: DC | PRN

## 2022-11-27 MED ORDER — MIDAZOLAM HCL 5 MG/5ML IJ SOLN
INTRAMUSCULAR | Status: DC | PRN
  Administered 2022-11-27: 2 mg via INTRAVENOUS

## 2022-11-27 SURGICAL SUPPLY — 44 items
ADH SKN CLS APL DERMABOND .7 (GAUZE/BANDAGES/DRESSINGS) ×1
APL PRP STRL LF DISP 70% ISPRP (MISCELLANEOUS) ×1
APPLIER CLIP 9.375 MED OPEN (MISCELLANEOUS) ×1
APR CLP MED 9.3 20 MLT OPN (MISCELLANEOUS) ×1
BLADE SURG 15 STRL LF DISP TIS (BLADE) ×1 IMPLANT
BLADE SURG 15 STRL SS (BLADE) ×1
CANISTER SUCT 1200ML W/VALVE (MISCELLANEOUS) IMPLANT
CHLORAPREP W/TINT 26 (MISCELLANEOUS) ×1 IMPLANT
CLIP APPLIE 9.375 MED OPEN (MISCELLANEOUS) ×1 IMPLANT
CLIP TI WIDE RED SMALL 6 (CLIP) IMPLANT
COVER BACK TABLE 60X90IN (DRAPES) ×1 IMPLANT
COVER MAYO STAND STRL (DRAPES) ×1 IMPLANT
COVER PROBE CYLINDRICAL 5X96 (MISCELLANEOUS) ×1 IMPLANT
DERMABOND ADVANCED .7 DNX12 (GAUZE/BANDAGES/DRESSINGS) ×1 IMPLANT
DRAPE LAPAROSCOPIC ABDOMINAL (DRAPES) ×1 IMPLANT
DRAPE UTILITY XL STRL (DRAPES) ×1 IMPLANT
ELECT REM PT RETURN 9FT ADLT (ELECTROSURGICAL) ×1
ELECTRODE REM PT RTRN 9FT ADLT (ELECTROSURGICAL) ×1 IMPLANT
GAUZE SPONGE 4X4 12PLY STRL LF (GAUZE/BANDAGES/DRESSINGS) IMPLANT
GLOVE SURG SIGNA 7.5 PF LTX (GLOVE) ×1 IMPLANT
GOWN STRL REUS W/ TWL LRG LVL3 (GOWN DISPOSABLE) ×1 IMPLANT
GOWN STRL REUS W/ TWL XL LVL3 (GOWN DISPOSABLE) ×1 IMPLANT
GOWN STRL REUS W/TWL LRG LVL3 (GOWN DISPOSABLE) ×1
GOWN STRL REUS W/TWL XL LVL3 (GOWN DISPOSABLE) ×1
KIT MARKER MARGIN INK (KITS) ×1 IMPLANT
NDL HYPO 25X1 1.5 SAFETY (NEEDLE) ×1 IMPLANT
NDL SAFETY ECLIP 18X1.5 (MISCELLANEOUS) ×1 IMPLANT
NEEDLE HYPO 25X1 1.5 SAFETY (NEEDLE) ×1 IMPLANT
NS IRRIG 1000ML POUR BTL (IV SOLUTION) ×1 IMPLANT
PACK BASIN DAY SURGERY FS (CUSTOM PROCEDURE TRAY) ×1 IMPLANT
PENCIL SMOKE EVACUATOR (MISCELLANEOUS) ×1 IMPLANT
SLEEVE SCD COMPRESS KNEE MED (STOCKING) ×1 IMPLANT
SPIKE FLUID TRANSFER (MISCELLANEOUS) IMPLANT
SPONGE T-LAP 4X18 ~~LOC~~+RFID (SPONGE) ×1 IMPLANT
SUT MNCRL AB 4-0 PS2 18 (SUTURE) ×1 IMPLANT
SUT SILK 2 0 SH (SUTURE) IMPLANT
SUT VIC AB 3-0 SH 27 (SUTURE) ×1
SUT VIC AB 3-0 SH 27X BRD (SUTURE) ×1 IMPLANT
SYR CONTROL 10ML LL (SYRINGE) ×1 IMPLANT
TOWEL GREEN STERILE FF (TOWEL DISPOSABLE) ×1 IMPLANT
TRACER MAGTRACE VIAL (MISCELLANEOUS) IMPLANT
TRAY FAXITRON CT DISP (TRAY / TRAY PROCEDURE) ×1 IMPLANT
TUBE CONNECTING 20X1/4 (TUBING) IMPLANT
YANKAUER SUCT BULB TIP NO VENT (SUCTIONS) IMPLANT

## 2022-11-27 NOTE — Discharge Instructions (Addendum)
[place] Office Phone Number [phone]  BREAST BIOPSY/ PARTIAL MASTECTOMY: POST OP INSTRUCTIONS  Always review your discharge instruction sheet given to you by the facility where your surgery was performed.  IF YOU HAVE DISABILITY OR FAMILY LEAVE FORMS, YOU MUST BRING THEM TO THE OFFICE FOR PROCESSING.  DO NOT GIVE THEM TO YOUR DOCTOR.  A prescription for pain medication may be given to you upon discharge.  Take your pain medication as prescribed, if needed.  If narcotic pain medicine is not needed, then you may take acetaminophen (Tylenol) or ibuprofen (Advil) as needed. Take your usually prescribed medications unless otherwise directed If you need a refill on your pain medication, please contact your pharmacy.  They will contact our office to request authorization.  Prescriptions will not be filled after 5pm or on week-ends. You should eat very light the first 24 hours after surgery, such as soup, crackers, pudding, etc.  Resume your normal diet the day after surgery. Most patients will experience some swelling and bruising in the breast.  Ice packs and a good support bra will help.  Swelling and bruising can take several days to resolve.  It is common to experience some constipation if taking pain medication after surgery.  Increasing fluid intake and taking a stool softener will usually help or prevent this problem from occurring.  A mild laxative (Milk of Magnesia or Miralax) should be taken according to package directions if there are no bowel movements after 48 hours. Unless discharge instructions indicate otherwise, you may remove your bandages 24-48 hours after surgery, and you may shower at that time.  You may have steri-strips (small skin tapes) in place directly over the incision.  These strips should be left on the skin for 7-10 days.  If your surgeon used skin glue on the incision, you may shower in 24 hours.  The glue will flake off over the next 2-3 weeks.  Any  sutures or staples will be removed at the office during your follow-up visit. ACTIVITIES:  You may resume regular daily activities (gradually increasing) beginning the next day.  Wearing a good support bra or sports bra minimizes pain and swelling.  You may have sexual intercourse when it is comfortable. You may drive when you no longer are taking prescription pain medication, you can comfortably wear a seatbelt, and you can safely maneuver your car and apply brakes. RETURN TO WORK:  ______________________________________________________________________________________ Dennis Bast should see your doctor in the office for a follow-up appointment approximately two weeks after your surgery.  Your doctor's nurse will typically make your follow-up appointment when she calls you with your pathology report.  Expect your pathology report 2-3 business days after your surgery.  You may call to check if you do not hear from [place] after three days. OTHER INSTRUCTIONS: _YOU MAY REMOVE THE BINDER AND THE SHOWER STARTING TOMORROW AND THEN PUT THE BINDER BACK ON ICE PACK AND TYLENOL, IBUPROFEN ALSO FOR PAIN NO VIGOROUS ACTIVITY FOR ONE WEEK ______________________________________________________________________________________________ _____________________________________________________________________________________________________________________________________ _____________________________________________________________________________________________________________________________________ _____________________________________________________________________________________________________________________________________  WHEN TO CALL YOUR DOCTOR: Fever over 101.0 Nausea and/or vomiting. Extreme swelling or bruising. Continued bleeding from incision. Increased pain, redness, or drainage from the incision.  The clinic staff is available to answer your questions during regular business hours.  Please don't hesitate to  call and ask to speak to one of the nurses for clinical concerns.  If you have a medical emergency, go to the nearest emergency room or call 911.  A surgeon from [facility] is  always on call at the hospital.  For further questions, please visit [email or web address]    Post Anesthesia Home Care Instructions  Activity: Get plenty of rest for the remainder of the day. A responsible individual must stay with you for 24 hours following the procedure.  For the next 24 hours, DO NOT: -Drive a car -Paediatric nurse -Drink alcoholic beverages -Take any medication unless instructed by your physician -Make any legal decisions or sign important papers.  Meals: Start with liquid foods such as gelatin or soup. Progress to regular foods as tolerated. Avoid greasy, spicy, heavy foods. If nausea and/or vomiting occur, drink only clear liquids until the nausea and/or vomiting subsides. Call your physician if vomiting continues.  Special Instructions/Symptoms: Your throat may feel dry or sore from the anesthesia or the breathing tube placed in your throat during surgery. If this causes discomfort, gargle with warm salt water. The discomfort should disappear within 24 hours.  If you had a scopolamine patch placed behind your ear for the management of post- operative nausea and/or vomiting:  1. The medication in the patch is effective for 72 hours, after which it should be removed.  Wrap patch in a tissue and discard in the trash. Wash hands thoroughly with soap and water. 2. You may remove the patch earlier than 72 hours if you experience unpleasant side effects which may include dry mouth, dizziness or visual disturbances. 3. Avoid touching the patch. Wash your hands with soap and water after contact with the patch.     Regional Anesthesia Blocks  1. Numbness or the inability to move the "blocked" extremity may last from 3-48 hours after placement. The length of time depends on the  medication injected and your individual response to the medication. If the numbness is not going away after 48 hours, call your surgeon.  2. The extremity that is blocked will need to be protected until the numbness is gone and the  Strength has returned. Because you cannot feel it, you will need to take extra care to avoid injury. Because it may be weak, you may have difficulty moving it or using it. You may not know what position it is in without looking at it while the block is in effect.  3. For blocks in the legs and feet, returning to weight bearing and walking needs to be done carefully. You will need to wait until the numbness is entirely gone and the strength has returned. You should be able to move your leg and foot normally before you try and bear weight or walk. You will need someone to be with you when you first try to ensure you do not fall and possibly risk injury.  4. Bruising and tenderness at the needle site are common side effects and will resolve in a few days.  5. Persistent numbness or new problems with movement should be communicated to the surgeon or the [facility] [phone] [place] [phone]).Information for Discharge Teaching: EXPAREL (bupivacaine liposome injectable suspension)   Your surgeon or anesthesiologist gave you EXPAREL(bupivacaine) to help control your pain after surgery.  EXPAREL is a local anesthetic that provides pain relief by numbing the tissue around the surgical site. EXPAREL is designed to release pain medication over time and can control pain for up to 72 hours. Depending on how you respond to EXPAREL, you may require less pain medication during your recovery.  Possible side effects: Temporary loss of sensation or ability to move in the area where  bupivacaine was injected. Nausea, vomiting, constipation Rarely, numbness and tingling in your mouth or lips, lightheadedness, or anxiety may occur. Call your doctor  right away if you think you may be experiencing any of these sensations, or if you have other questions regarding possible side effects.  Follow all other discharge instructions given to you by your surgeon or nurse. Eat a healthy diet and drink plenty of water or other fluids.  If you return to the hospital for any reason within 96 hours following the administration of EXPAREL, it is important for health care providers to know that you have received this anesthetic. A teal colored band has been placed on your arm with the date, time and amount of EXPAREL you have received in order to alert and inform your health care providers. Please leave this armband in place for the full 96 hours following administration, and then you may remove the band.   Next dose of Tylenol can be given at 3:30pm if needed.

## 2022-11-27 NOTE — Addendum Note (Signed)
Addendum  created 11/27/22 1319 by Lowella Dell, CRNA   Intraprocedure Event edited

## 2022-11-27 NOTE — Anesthesia Postprocedure Evaluation (Signed)
Anesthesia Post Note  Patient: Pegah Suchodolski  Procedure(s) Performed: RIGHT BREAST LUMPECTOMY WITH RADIOACTIVE SEED AND SENTINEL LYMPH NODE BIOPSY (Right: Chest)     Patient location during evaluation: PACU Anesthesia Type: General Level of consciousness: awake and alert and oriented Pain management: pain level controlled Vital Signs Assessment: post-procedure vital signs reviewed and stable Respiratory status: spontaneous breathing, nonlabored ventilation and respiratory function stable Cardiovascular status: blood pressure returned to baseline and stable Postop Assessment: no apparent nausea or vomiting Anesthetic complications: no   No notable events documented.  Last Vitals:  Vitals:   11/27/22 1155 11/27/22 1219  BP: 102/70 129/69  Pulse: 87 85  Resp: 17 18  Temp:  (!) 36.2 C  SpO2: 95% 96%    Last Pain:  Vitals:   11/27/22 1219  TempSrc: Oral  PainSc: 0-No pain                 Yurem Viner A.

## 2022-11-27 NOTE — Transfer of Care (Signed)
Immediate Anesthesia Transfer of Care Note  Patient: Connie Alvarez  Procedure(s) Performed: RIGHT BREAST LUMPECTOMY WITH RADIOACTIVE SEED AND SENTINEL LYMPH NODE BIOPSY (Right: Chest)  Patient Location: PACU  Anesthesia Type:General  Level of Consciousness: drowsy  Airway & Oxygen Therapy: Patient Spontanous Breathing and Patient connected to nasal cannula oxygen  Post-op Assessment: Report given to RN and Post -op Vital signs reviewed and stable  Post vital signs: Reviewed and stable  Last Vitals:  Vitals Value Taken Time  BP 112/59 11/27/22 1134  Temp    Pulse 79 11/27/22 1134  Resp 13 11/27/22 1134  SpO2 100 % 11/27/22 1134    Last Pain:  Vitals:   11/27/22 0930  TempSrc: Oral  PainSc: 0-No pain      Patients Stated Pain Goal: 3 ([id number])  Complications: No notable events documented.

## 2022-11-27 NOTE — Op Note (Signed)
   Connie Alvarez 11/27/2022   Pre-op Diagnosis: RIGHT BREAST CANCER     Post-op Diagnosis: same  Procedure(s): RADIOACTIVE SEED GUIDED RIGHT BREAST LUMPECTOMY DEEP RIGHT AXILLARY SENTINEL LYMPH NODE BIOPSY INJECTION OF MAG TRACE FOR LYMPH NODE MAPPING   Surgeon(s): Coralie Keens, MD  Anesthesia: General  Staff:  Circulator: Merwyn Katos, RN Scrub Person: Anson Crofts, RN  Estimated Blood Loss: Minimal               Specimens:sent to path  Indications: This is a 62 year old female was found on recent screen mammography to have a small distortion at the 12 o'clock position of the right breast.  She underwent an ultrasound showing a 5 mm mass with 7 total millimeters of acoustic shadowing.  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  The decision was made to proceed with a radioactive seed guided right breast lumpectomy and sentinel lymph node biopsy  Procedure: The patient received a preoperative block by the anesthesiologist in preoperative holding area.  She was then brought to the operating room.  She was placed upon the operating table and general anesthesia was induced I next injected MAC trace under sterile technique underneath the right nipple areolar complex and massaged the breast.  Her right breast and axilla were then prepped and draped in usual sterile fashion.  The radioactive seed was located approximately the 11 o'clock position of the right breast with the neoprobe.  I anesthetized the superior edge of the areola with Marcaine and then made a circumareolar incision with a scalpel.  I then dissected slightly superior to of the signal from the radioactive seed.  I then stayed widely around the seed with the aid of the neoprobe dissecting in all directions with electrocautery.  I then completed the dissection coming underneath the lumpectomy specimen.  I marked all margins with paint.  An x-ray was performed confirming that the radioactive seed and biopsy  clip were in the center of the specimen.  The specimen was then sent to pathology for evaluation.  I achieved hemostasis in the lumpectomy cavity with the cautery.  Using the mag trace probe I then identified an area of increased uptake in the right axilla.  I anesthetized the skin with Marcaine.  I then made incision with a scalpel.  I then dissected down into the deep right axillary tissue with the aid of the cautery.  Using the neoprobe identified 2 sentinel lymph nodes were excised with the aid of the electrocautery and surgical clips.  The lymph nodes with the surrounding fatty tissue were sent to pathology for evaluation.  I found no other increased uptake of the mag trace and no palpable lymph nodes.  I achieved hemostasis with the cautery and surgical clips.  I anesthetized the lumpectomy cavity further with Marcaine and placed surgical clips around the periphery for marking purposes.  I then closed both incisions with interrupted 3-0 Vicryl sutures and running 4-0 Monocryl sutures in the skin.  Dermabond was then applied to both incisions.  The patient tolerated the procedure well.  All the counts were correct at the end of the procedure.  The patient was then extubated in the operating room and taken in a stable condition to the recovery room.          Coralie Keens   Date: 11/27/2022  Time: 11:22 AM

## 2022-11-27 NOTE — Anesthesia Preprocedure Evaluation (Signed)
Anesthesia Evaluation  Patient identified by MRN, date of birth, ID band Patient awake    Reviewed: Allergy & Precautions, NPO status , Patient's Chart, lab work & pertinent test results  History of Anesthesia Complications (+) AWARENESS UNDER ANESTHESIA and history of anesthetic complications  Airway Mallampati: II  TM Distance: >3 FB Neck ROM: Full    Dental no notable dental hx. (+) Teeth Intact, Dental Advisory Given, Caps   Pulmonary neg pulmonary ROS   Pulmonary exam normal breath sounds clear to auscultation       Cardiovascular negative cardio ROS Normal cardiovascular exam Rhythm:Regular Rate:Normal     Neuro/Psych negative neurological ROS  negative psych ROS   GI/Hepatic negative GI ROS, Neg liver ROS,,,  Endo/Other  Right breast Ca  Renal/GU negative Renal ROS  negative genitourinary   Musculoskeletal negative musculoskeletal ROS (+)    Abdominal   Peds  Hematology negative hematology ROS (+)   Anesthesia Other Findings   Reproductive/Obstetrics                              Anesthesia Physical Anesthesia Plan  ASA: 2  Anesthesia Plan: General   Post-op Pain Management: Minimal or no pain anticipated, Regional block*, Dilaudid IV, Precedex and Tylenol PO (pre-op)*   Induction:   PONV Risk Score and Plan: 4 or greater and Treatment may vary due to age or medical condition, Ondansetron and Dexamethasone  Airway Management Planned: LMA  Additional Equipment: None  Intra-op Plan:   Post-operative Plan: Extubation in OR  Informed Consent: I have reviewed the patients History and Physical, chart, labs and discussed the procedure including the risks, benefits and alternatives for the proposed anesthesia with the patient or authorized representative who has indicated his/her understanding and acceptance.     Dental advisory given  Plan Discussed with:  Anesthesiologist and CRNA  Anesthesia Plan Comments:          Anesthesia Quick Evaluation

## 2022-11-27 NOTE — Anesthesia Procedure Notes (Signed)
Anesthesia Regional Block: Pectoralis block   Pre-Anesthetic Checklist: , timeout performed,  Correct Patient, Correct Site, Correct Laterality,  Correct Procedure, Correct Position, site marked,  Risks and benefits discussed,  Surgical consent,  Pre-op evaluation,  At surgeon's request and post-op pain management  Laterality: Right  Prep: chloraprep       Needles:  Injection technique: Single-shot  Needle Type: Echogenic Stimulator Needle     Needle Length: 10cm  Needle Gauge: 21   Needle insertion depth: 7 cm   Additional Needles:   Procedures:,,,, ultrasound used (permanent image in chart),,    Narrative:  Start time: 11/27/2022 10:05 AM End time: 11/27/2022 10:10 AM Injection made incrementally with aspirations every 5 mL.  Performed by: Personally  Anesthesiologist: Josephine Igo, MD  Additional Notes: Timeout performed. Patient sedated. Relevant anatomy ID'd using [place]. Incremental 2-71ml injection of LA with frequent aspiration. Patient tolerated procedure well.

## 2022-11-27 NOTE — Progress Notes (Signed)
Assisted Dr. Foster with right, pectoralis, ultrasound guided block. Side rails up, monitors on throughout procedure. See vital signs in flow sheet. Tolerated Procedure well. 

## 2022-11-27 NOTE — Anesthesia Procedure Notes (Signed)
Procedure Name: LMA Insertion Date/Time: 11/27/2022 10:30 AM  Performed by: Lowella Dell, CRNAPre-anesthesia Checklist: Patient identified, Emergency Drugs available, Suction available and Patient being monitored Patient Re-evaluated:Patient Re-evaluated prior to induction Oxygen Delivery Method: Circle System Utilized Preoxygenation: Pre-oxygenation with 100% oxygen Induction Type: IV induction Ventilation: Mask ventilation without difficulty LMA: LMA inserted LMA Size: 4.0 Number of attempts: 1 Airway Equipment and Method: Bite block Placement Confirmation: positive ETCO2 Tube secured with: Tape Dental Injury: Teeth and Oropharynx as per pre-operative assessment

## 2022-11-27 NOTE — Interval H&P Note (Signed)
History and Physical Interval Note: no change in the H and P  11/27/2022 9:53 AM  Connie Alvarez  has presented today for surgery, with the diagnosis of RIGHT BREAST CANCER.  The various methods of treatment have been discussed with the patient and family. After consideration of risks, benefits and other options for treatment, the patient has consented to  Procedure(s): RIGHT BREAST LUMPECTOMY WITH RADIOACTIVE SEED AND SENTINEL LYMPH NODE BIOPSY (Right) as a surgical intervention.  The patient's history has been reviewed, patient examined, no change in status, stable for surgery.  I have reviewed the patient's chart and labs.  Questions were answered to the patient's satisfaction.     Coralie Keens

## 2022-11-28 ENCOUNTER — Encounter (HOSPITAL_BASED_OUTPATIENT_CLINIC_OR_DEPARTMENT_OTHER): Payer: Self-pay | Admitting: Surgery

## 2022-11-28 LAB — SURGICAL PATHOLOGY

## 2022-12-01 ENCOUNTER — Encounter: Payer: Self-pay | Admitting: *Deleted

## 2022-12-01 ENCOUNTER — Telehealth: Payer: Self-pay | Admitting: *Deleted

## 2022-12-01 NOTE — Telephone Encounter (Signed)
Received order for oncotype testing. Requisition faxed to pathology and [facility] °

## 2022-12-09 ENCOUNTER — Telehealth: Payer: Self-pay | Admitting: Hematology and Oncology

## 2022-12-09 DIAGNOSIS — Z170 Estrogen receptor positive status [ER+]: Secondary | ICD-10-CM | POA: Diagnosis not present

## 2022-12-09 DIAGNOSIS — C50411 Malignant neoplasm of upper-outer quadrant of right female breast: Secondary | ICD-10-CM | POA: Diagnosis not present

## 2022-12-09 NOTE — Telephone Encounter (Signed)
Patient called to move her appointment date.

## 2022-12-10 ENCOUNTER — Encounter: Payer: Self-pay | Admitting: Hematology and Oncology

## 2022-12-11 ENCOUNTER — Telehealth: Payer: Self-pay | Admitting: Hematology and Oncology

## 2022-12-11 ENCOUNTER — Encounter: Payer: Self-pay | Admitting: *Deleted

## 2022-12-11 ENCOUNTER — Inpatient Hospital Stay: Payer: BC Managed Care – PPO | Attending: Hematology and Oncology | Admitting: Hematology and Oncology

## 2022-12-11 VITALS — BP 124/61 | HR 96 | Temp 97.5°F | Resp 17 | Ht 66.0 in | Wt 166.4 lb

## 2022-12-11 DIAGNOSIS — Z8616 Personal history of COVID-19: Secondary | ICD-10-CM | POA: Insufficient documentation

## 2022-12-11 DIAGNOSIS — Z170 Estrogen receptor positive status [ER+]: Secondary | ICD-10-CM | POA: Insufficient documentation

## 2022-12-11 DIAGNOSIS — Z8049 Family history of malignant neoplasm of other genital organs: Secondary | ICD-10-CM | POA: Diagnosis not present

## 2022-12-11 DIAGNOSIS — H2512 Age-related nuclear cataract, left eye: Secondary | ICD-10-CM | POA: Diagnosis not present

## 2022-12-11 DIAGNOSIS — Z808 Family history of malignant neoplasm of other organs or systems: Secondary | ICD-10-CM | POA: Insufficient documentation

## 2022-12-11 DIAGNOSIS — H5203 Hypermetropia, bilateral: Secondary | ICD-10-CM | POA: Diagnosis not present

## 2022-12-11 DIAGNOSIS — C50911 Malignant neoplasm of unspecified site of right female breast: Secondary | ICD-10-CM

## 2022-12-11 DIAGNOSIS — H52203 Unspecified astigmatism, bilateral: Secondary | ICD-10-CM | POA: Diagnosis not present

## 2022-12-11 DIAGNOSIS — Z803 Family history of malignant neoplasm of breast: Secondary | ICD-10-CM | POA: Insufficient documentation

## 2022-12-11 DIAGNOSIS — Z9889 Other specified postprocedural states: Secondary | ICD-10-CM | POA: Diagnosis not present

## 2022-12-11 DIAGNOSIS — H524 Presbyopia: Secondary | ICD-10-CM | POA: Diagnosis not present

## 2022-12-11 DIAGNOSIS — C50811 Malignant neoplasm of overlapping sites of right female breast: Secondary | ICD-10-CM | POA: Diagnosis not present

## 2022-12-11 DIAGNOSIS — Z961 Presence of intraocular lens: Secondary | ICD-10-CM | POA: Diagnosis not present

## 2022-12-11 NOTE — Assessment & Plan Note (Addendum)
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  Given very small tumor and strong ER/PR positivity, she will proceed with upfront surgery, lumpectomy followed by Oncotype testing.  Oncotype DX of 25, no definitive benefit from adjuvant chemotherapy.  She will now proceed with adjuvant radiation followed by antiestrogen therapy.  We have discussed once again about options for antiestrogen therapy including tamoxifen versus aromatase inhibitors in detail.  I have given her printed information about both these medications.  She can return to clinic after completing radiation. At this time we are leaning towards aromatase inhibitors.  We have discussed about mechanism of action, adverse effects including but not limited to postmenopausal symptoms such as hot flashes, vaginal dryness, bone density loss increased risk of cardiovascular events, arthralgias etc. She is agreeable to this plan.  Duration of antiestrogen therapy will be 5 years.

## 2022-12-11 NOTE — Telephone Encounter (Signed)
Spoke with patient confirming upcoming appointment  

## 2022-12-11 NOTE — Progress Notes (Signed)
[facility] CONSULT NOTE  Patient Care Team: Daisy Floro, MD as PCP - General (Family Medicine) Olivia Mackie, NP as Nurse Practitioner (Gynecology) Abigail Miyamoto, MD as Consulting Physician (General Surgery) Lonie Peak, MD as Attending Physician (Radiation Oncology) Rachel Moulds, MD as Consulting Physician (Hematology and Oncology) Pershing Proud, RN as Oncology Nurse Navigator Donnelly Angelica, RN as Oncology Nurse Navigator  CHIEF COMPLAINTS/PURPOSE OF CONSULTATION:  Right sided breast cancer  HISTORY OF PRESENTING ILLNESS:  Connie Alvarez 62 y.o. female is here because of recent diagnosis of right breast cancer  I reviewed her records extensively and collaborated the history with the patient.  SUMMARY OF ONCOLOGIC HISTORY: Oncology History  Invasive ductal carcinoma of right breast, stage 1  10/06/2022 Mammogram   Mammogram showed possible distortion in the right breast warranting further evaluation.  Diagnostic mammogram showed a 7 mm mass in the 12 o'clock position of the right breast with imaging features suspicious for malignancy including associated focal distortion.  Ultrasound done on February 26 confirmed the same findings.   10/27/2022 Pathology Results   Pathology from the right breast needle core biopsy showed invasive moderately differentiated ductal carcinoma, grade 2, DCIS intermediate nuclear grade, negative for angiolymphatic invasion negative for micro calcs, invasive tumor measuring 4 mm in greatest linear extent, ER 100% positive strong staining PR 30% positive strong staining Ki-67 15% and group 5 HER2 negative   11/05/2022 Initial Diagnosis   Invasive ductal carcinoma of right breast, stage 1 (HCC)   11/11/2022 Genetic Testing   Negative [facility] Common Hereditary Cancers +RNA + MITF +POT1.  VUS in PDGFRA at [id number] [facility]).  Report date is 11/11/2022.    The [facility] Common Hereditary Cancers + RNA +MITF + POT1 Panel  includes sequencing, deletion/duplication, and RNA analysis of the following 48 genes: APC, ATM, AXIN2, BAP1, BARD1, BMPR1A, BRCA1, BRCA2, BRIP1, CDH1, CDK4*, CDKN2A*, CHEK2, CTNNA1, DICER1, EPCAM* (del/dup only), FH, GREM1* (promoter dup analysis only), HOXB13*, KIT*, MBD4*, MITF, MEN1, MLH1, MSH2, MSH3, MSH6, MUTYH, NF1, NTHL1, PALB2, PDGFRA*, PMS2, POLD1, POLE, POT1, PTEN, RAD51C, RAD51D, SDHA (sequencing only), SDHB, SDHC, SDHD, SMAD4, SMARCA4, STK11, TP53, TSC1, TSC2, VHL.  *Genes without RNA analysis.    Malignant neoplasm of upper-outer quadrant of right breast in female, estrogen receptor positive  11/05/2022 Initial Diagnosis   Malignant neoplasm of upper-outer quadrant of right breast in female, estrogen receptor positive (HCC)    Patient arrived to the appointment with Mr Nakata. Since her last visit here, she had right breast lumpectomy, 9 mm grade 2 IDC, DCIS intermediate grade, resection margins neg ER 100% positive strong staining intensity PR 30% positive strong staining intensity HER2 negative and Ki-67 of 15%. Oncotype DX of 25, benefit of chemotherapy less than 1%. She denies any health complaints, she has been healing well from surgery. Review of systems otherwise reviewed and negative  MEDICAL HISTORY:  Past Medical History:  Diagnosis Date   Bursitis    Complication of anesthesia    woke up during hysterectomy   COVID 05/2021   Endometriosis    Lichen sclerosus     SURGICAL HISTORY: Past Surgical History:  Procedure Laterality Date   ABDOMINAL HYSTERECTOMY  2002   TAH,BSO/ENDOMETRIOSIS   BREAST BIOPSY Right 10/27/2022   [place] RT BREAST BX W LOC DEV 1ST LESION IMG BX SPEC US GUIDE 10/27/2022 [facility]   BREAST BIOPSY  11/26/2022   MM RT RADIOACTIVE SEED LOC MAMMO GUIDE 11/26/2022 [facility]   BREAST LUMPECTOMY WITH  RADIOACTIVE SEED AND SENTINEL LYMPH NODE BIOPSY Right 11/27/2022   Procedure: RIGHT BREAST LUMPECTOMY WITH RADIOACTIVE SEED AND SENTINEL LYMPH  NODE BIOPSY;  Surgeon: Abigail Miyamoto, MD;  Location: [facility];  Service: General;  Laterality: Right;   BUNIONECTOMY  1981 AND 1989   '81 RIGHT AND '89 LEFT   cataract surgery     right eye   OOPHORECTOMY     BSO    SOCIAL HISTORY: Social History   Socioeconomic History   Marital status: Married    Spouse name: Not on file   Number of children: Not on file   Years of education: Not on file   Highest education level: Not on file  Occupational History   Not on file  Tobacco Use   Smoking status: Never   Smokeless tobacco: Never  Vaping Use   Vaping Use: Never used  Substance and Sexual Activity   Alcohol use: Yes    Alcohol/week: 0.0 standard drinks of alcohol    Comment: rare   Drug use: No   Sexual activity: Yes    Birth control/protection: Surgical    Comment: INTERCOURSE AGE 63, SEXUAL PARTNERS LESS THAN 5  Other Topics Concern   Not on file  Social History Narrative   Not on file   Social Determinants of Health   Financial Resource Strain: Low Risk  (11/05/2022)   Overall Financial Resource Strain (CARDIA)    Difficulty of Paying Living Expenses: Not hard at all  Food Insecurity: No Food Insecurity (11/05/2022)   Hunger Vital Sign    Worried About Running Out of Food in the Last Year: Never true    Ran Out of Food in the Last Year: Never true  Transportation Needs: No Transportation Needs (11/05/2022)   PRAPARE - Administrator, Civil Service (Medical): No    Lack of Transportation (Non-Medical): No  Physical Activity: Not on file  Stress: Not on file  Social Connections: Not on file  Intimate Partner Violence: Not on file    FAMILY HISTORY: Family History  Problem Relation Age of Onset   Diabetes Mother    Hypertension Mother    Diabetes Sister    Uterine cancer Sister        dx late 34s   Breast cancer Maternal Aunt        dx 70s-80s   Melanoma Maternal Aunt        d. ~70; mets    ALLERGIES:  has No Known  Allergies.  MEDICATIONS:  Current Outpatient Medications  Medication Sig Dispense Refill   clobetasol ointment (TEMOVATE) 0.05 % Apply 1 Application topically 2 (two) times daily. 30 g 0   fluconazole (DIFLUCAN) 150 MG tablet Take 1 tablet (150 mg total) by mouth every 3 (three) days. 2 tablet 0   Ibuprofen (ADVIL PO) Take by mouth as needed.      Multiple Vitamin (MULTIVITAMIN) capsule Take 1 capsule by mouth daily.       traMADol (ULTRAM) 50 MG tablet Take 1 tablet (50 mg total) by mouth every 6 (six) hours as needed for moderate pain or severe pain. 25 tablet 0   VITAMIN D PO Take by mouth.     vitamin E 45 MG (100 UNITS) capsule 1 capsule     No current facility-administered medications for this visit.    REVIEW OF SYSTEMS:   Constitutional: Denies fevers, chills or abnormal night sweats Eyes: Denies blurriness of vision, double vision or watery eyes Ears, nose,  mouth, throat, and face: Denies mucositis or sore throat Respiratory: Denies cough, dyspnea or wheezes Cardiovascular: Denies palpitation, chest discomfort or lower extremity swelling Gastrointestinal:  Denies nausea, heartburn or change in bowel habits Skin: Denies abnormal skin rashes Lymphatics: Denies new lymphadenopathy or easy bruising Neurological:Denies numbness, tingling or new weaknesses Behavioral/Psych: Mood is stable, no new changes  Breast: Denies any palpable lumps or discharge All other systems were reviewed with the patient and are negative.  PHYSICAL EXAMINATION: ECOG PERFORMANCE STATUS: 0 - Asymptomatic  Vitals:   12/11/22 0831  BP: 124/61  Pulse: 96  Resp: 17  Temp: (!) 97.5 F (36.4 C)  SpO2: 99%    Filed Weights   12/11/22 0831  Weight: 166 lb 6.4 oz (75.5 kg)     GENERAL:alert, no distress and comfortable   LABORATORY DATA:  I have reviewed the data as listed Lab Results  Component Value Date   WBC 5.7 04/11/2021   HGB 13.4 04/11/2021   HCT 39.9 04/11/2021   MCV 86.2  04/11/2021   PLT 279 04/11/2021   Lab Results  Component Value Date   NA 139 04/11/2021   K 4.0 04/11/2021   CL 106 04/11/2021   CO2 25 04/11/2021    RADIOGRAPHIC STUDIES: I have personally reviewed the radiological reports and agreed with the findings in the report.  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  Given very small tumor and strong ER/PR positivity, she will proceed with upfront surgery, lumpectomy followed by Oncotype testing.  Oncotype DX of 25, no definitive benefit from adjuvant chemotherapy.  She will now proceed with adjuvant radiation followed by antiestrogen therapy.  We have discussed once again about options for antiestrogen therapy including tamoxifen versus aromatase inhibitors in detail.  I have given her printed information about both these medications.  She can return to clinic after completing radiation. At this time we are leaning towards aromatase inhibitors.  We have discussed about mechanism of action, adverse effects including but not limited to postmenopausal symptoms such as hot flashes, vaginal dryness, bone density loss increased risk of cardiovascular events, arthralgias etc. She is agreeable to this plan.  Duration of antiestrogen therapy will be 5 years.    Total time spent: 45 minutes including breast MDC discussion, history, physical exam, review of records, counseling and coordination of care  All questions were answered. The patient knows to call the clinic with any problems, questions or concerns.    Rachel Moulds, MD 12/11/22

## 2022-12-12 NOTE — Progress Notes (Signed)
Location of Breast Cancer: Malignant neoplasm of upper-outer quadrant of right breast in female, estrogen receptor positive (HCC)   Histology per Pathology Report:  11-27-22 FINAL MICROSCOPIC DIAGNOSIS:  A. BREAST, RIGHT, LUMPECTOMY: - Invasive ductal carcinoma, 0.9 cm, grade 2 - Ductal carcinoma in situ, intermediate grade - Resection margins are negative for carcinoma; closest is the superior margin at 0.3 cm - Biopsy site changes - See oncology table  B. LYMPH NODE, RIGHT AXILLARY, SENTINEL, EXCISION: - Lymph node, negative for carcinoma (0/1)  C. LYMPH NODE, RIGHT AXILLARY, SENTINEL, EXCISION: - Lymph node, negative for carcinoma (0/1)  ONCOLOGY TABLE:  Procedure: Lumpectomy Specimen Laterality: Right Histologic Type: Invasive ductal carcinoma Histologic Grade:      Glandular (Acinar)/Tubular Differentiation: 3      Nuclear Pleomorphism: 2      Mitotic Rate: 1      Overall Grade: 2 Tumor Size: 0.9 cm Ductal Carcinoma In Situ: Present, intermediate grade Treatment Effect in the Breast: No known presurgical therapy Margins: All margins negative for invasive carcinoma      Distance from Closest Margin (mm): 3 mm      Specify Closest Margin (required only if <45mm): Superior margin DCIS Margins: Uninvolved by DCIS      Distance from Closest Margin (mm): 3 mm      Specify Closest Margin (required only if <72mm): Superior margin Regional Lymph Nodes:      Number of Lymph Nodes Examined: 2      Number of Sentinel Nodes Examined: 2      Number of Lymph Nodes with Macrometastases (>2 mm): 0      Number of Lymph Nodes with Micrometastases: 0      Number of Lymph Nodes with Isolated Tumor Cells (=0.2 mm or =200 cells): 0      Size of Largest Metastatic Deposit (mm): Not applicable      Extranodal Extension: Not applicable Distant Metastasis:      Distant Site(s) Involved: Not applicable Breast Biomarker Testing Performed on Previous Biopsy:      Testing Performed on Case  Number: [id number]            Estrogen Receptor: 100%, positive, strong staining intensity            Progesterone Receptor: 30%, positive, strong staining intensity            HER2: Negative (FISH)            Ki-67: 15% Pathologic Stage Classification (pTNM, AJCC 8th Edition): pT1b, pN0 Representative Tumor Block: A3 Comment(s): None  ([id number])  Receptor Status: ER(100%), PR (30%), Her2-neu (neg), Ki-67(15%)  Did patient present with symptoms (if so, please note symptoms) or was this found on screening mammography?: screening mammogram  Past/Anticipated interventions by surgeon, if any: 11-27-22  Procedure(s): RADIOACTIVE SEED GUIDED RIGHT BREAST LUMPECTOMY DEEP RIGHT AXILLARY SENTINEL LYMPH NODE BIOPSY INJECTION OF MAG TRACE FOR LYMPH NODE MAPPING Surgeon(s): Abigail Miyamoto, MD  She wished to proceed with breast conservation. I next discussed proceeding with a radioactive seed guided right breast lumpectomy and right axillary sentinel lymph node biopsy.   Past/Anticipated interventions by medical oncology, if any:  Dr. Al Pimple on 12-11-22 Oncology History  Invasive ductal carcinoma of right breast, stage 1  10/06/2022 Mammogram    Mammogram showed possible distortion in the right breast warranting further evaluation.  Diagnostic mammogram showed a 7 mm mass in the 12 o'clock position of the right breast with imaging features suspicious for malignancy including associated focal distortion.  Ultrasound  done on February 26 confirmed the same findings.    10/27/2022 Pathology Results    Pathology from the right breast needle core biopsy showed invasive moderately differentiated ductal carcinoma, grade 2, DCIS intermediate nuclear grade, negative for angiolymphatic invasion negative for micro calcs, invasive tumor measuring 4 mm in greatest linear extent, ER 100% positive strong staining PR 30% positive strong staining Ki-67 15% and group 5 HER2 negative    11/05/2022 Initial Diagnosis     Invasive ductal carcinoma of right breast, stage 1 (HCC)    11/11/2022 Genetic Testing    Negative [facility] Common Hereditary Cancers +RNA + MITF +POT1.  VUS in PDGFRA at c.2897A>G [facility]).  Report date is 11/11/2022.     The [facility] Common Hereditary Cancers + RNA +MITF + POT1 Panel includes sequencing, deletion/duplication, and RNA analysis of the following 48 genes: APC, ATM, AXIN2, BAP1, BARD1, BMPR1A, BRCA1, BRCA2, BRIP1, CDH1, CDK4*, CDKN2A*, CHEK2, CTNNA1, DICER1, EPCAM* (del/dup only), FH, GREM1* (promoter dup analysis only), HOXB13*, KIT*, MBD4*, MITF, MEN1, MLH1, MSH2, MSH3, MSH6, MUTYH, NF1, NTHL1, PALB2, PDGFRA*, PMS2, POLD1, POLE, POT1, PTEN, RAD51C, RAD51D, SDHA (sequencing only), SDHB, SDHC, SDHD, SMAD4, SMARCA4, STK11, TP53, TSC1, TSC2, VHL.  *Genes without RNA analysis.     Malignant neoplasm of upper-outer quadrant of right breast in female, estrogen receptor positive  11/05/2022 Initial Diagnosis    Malignant neoplasm of upper-outer quadrant of right breast in female, estrogen receptor positive (HCC)    Lymphedema issues, if any:  none to report  Pain issues, if any: none to report  SAFETY ISSUES: Prior radiation? no Pacemaker/ICD? no Possible current pregnancy?no Is the patient on methotrexate? no  Current Complaints / other details:  Pt only had question about darkening area one side of right breast. She states no swelling or pain is associated with this darkening.

## 2022-12-15 ENCOUNTER — Encounter: Payer: Self-pay | Admitting: Physical Therapy

## 2022-12-16 ENCOUNTER — Encounter: Payer: Self-pay | Admitting: *Deleted

## 2022-12-24 ENCOUNTER — Ambulatory Visit: Payer: BC Managed Care – PPO | Admitting: Hematology and Oncology

## 2022-12-25 ENCOUNTER — Encounter: Payer: Self-pay | Admitting: Radiation Oncology

## 2022-12-25 ENCOUNTER — Telehealth: Payer: Self-pay

## 2022-12-25 NOTE — Progress Notes (Signed)
Radiation Oncology         [phone] ________________________________  Name: Connie Alvarez MRN: [id number]  Date: 12/26/2022  DOB: 11/02/1960  Follow-Up Visit Note  Outpatient  CC: Daisy Floro, MD  Rachel Moulds, MD  Diagnosis:    [phone]   ICD-10-CM   1. Invasive ductal carcinoma of right breast, stage 1 (HCC)  C50.911     2. Malignant neoplasm of upper-outer quadrant of right breast in female, estrogen receptor positive (HCC)  C50.411    Z17.0      Stage IA (cT1b, cN0, cM0) Right Breast UOQ, Invasive and in situ ductal carcinoma, ER+ / PR+ / Her2-, Grade 2: s/p lumpectomy and SLN biopsies with clean margins and negative nodes pT1b, pN0   CHIEF COMPLAINT: Here to discuss management of right breast cancer  Narrative:  The patient returns today for follow-up.     On her breast clinic consultation date of 11/05/22, she opted to pursue genetic testing. Results showed no clinically significant variants by [facility] genetic testing. However, a variant of uncertain significance was detected in the PDGFRA gene.   She opted to proceed with a right breast lumpectomy with right axillary SLN biopsies on 11/27/22 under the care of Dr. Magnus Ivan. Pathology from the procedure revealed: tumor size of 9 mm; histology of grade 2 invasive ductal carcinoma with intermediate grade DCIS (negative for LVI); all margins negative for invasive and in situ carcinoma; margin status to invasive and in situ disease of 3 mm from superior margin; nodal status of 2/2 right axillary sentinel lymph node excisions negative for carcinoma;  ER status: 100% positive and PR status 30% positive, both with strong staining intensity; Proliferation marker Ki67 at 15%; Her2 status negative; Grade 2.  Oncotype DX was obtained on the final surgical sample and the recurrence score of 25 predicted a risk of recurrence outside the breast over the next 9 years of 12%, if the patient's only systemic therapy were to be an  antiestrogen for 5 years. It also predicts no significant benefit from chemotherapy.  The patient has met with Dr. Al Pimple and she is interested in proceeding with AI following XRT. Planned duration is for 5 years. She will return to Dr. Al Pimple following XRT to discuss AI options further.   Symptomatically, the patient reports:  SAFETY ISSUES: Prior radiation? no Pacemaker/ICD? no Possible current pregnancy?no Is the patient on methotrexate? no  Current Complaints / other details:  Pt has noted progressive stretch marks/changes in the texture of her skin at the lower inner right breast since surgery with some mild associated erythema         ALLERGIES:  has No Known Allergies.  Meds: Current Outpatient Medications  Medication Sig Dispense Refill   clobetasol ointment (TEMOVATE) 0.05 % Apply 1 Application topically 2 (two) times daily. 30 g 0   Ibuprofen (ADVIL PO) Take by mouth as needed.      Multiple Vitamin (MULTIVITAMIN) capsule Take 1 capsule by mouth daily.       VITAMIN D PO Take by mouth.     vitamin E 45 MG (100 UNITS) capsule 1 capsule     No current facility-administered medications for this encounter.    Physical Findings:  height is 5\' 6"  (1.676 m) and weight is 167 lb 2 oz (75.8 kg). Her temporal temperature is 96.8 F (36 C) (abnormal). Her blood pressure is 136/68 and her pulse is 76. Her respiration is 18 and oxygen saturation is 99%. Marland Kitchen  General: Alert and oriented, in no acute distress HEENT: Head is normocephalic. Extraocular movements are intact.  Extremities: No cyanosis or edema. Musculoskeletal: symmetric strength and muscle tone throughout. Neurologic: No obvious focalities. Speech is fluent.  Psychiatric: Judgment and insight are intact. Affect is appropriate. MSK: Good range of motion in shoulders Breast exam reveals satisfactory healing of axillary and lumpectomy scars, right breast, with darkening at the [facility] trace site.  The lower inner right breast  has superficial textured streaks in the skin that may be consistent with lymphedema/swelling.  Patient advised to discuss with her physical therapist  Lab Findings: Lab Results  Component Value Date   WBC 5.7 04/11/2021   HGB 13.4 04/11/2021   HCT 39.9 04/11/2021   MCV 86.2 04/11/2021   PLT 279 04/11/2021      Radiographic Findings: MM Breast Surgical Specimen  Result Date: 11/27/2022 CLINICAL DATA:  Post right breast excision. EXAM: SPECIMEN RADIOGRAPH OF THE RIGHT BREAST COMPARISON:  Previous exam(s). FINDINGS: Status post excision of the right breast. The radioactive seed and biopsy marker clip are present, completely intact, and were marked for pathology. IMPRESSION: Specimen radiograph of the right breast. Electronically Signed   By: Edwin Cap M.D.   On: 11/27/2022 11:00  MM RT RADIOACTIVE SEED LOC MAMMO GUIDE  Result Date: 11/26/2022 CLINICAL DATA:  Patient with a RIGHT breast invasive carcinoma with DCIS scheduled for lumpectomy requiring preoperative radioactive seed localization. EXAM: MAMMOGRAPHIC GUIDED RADIOACTIVE SEED LOCALIZATION OF THE RIGHT BREAST COMPARISON:  Previous exam(s). FINDINGS: Patient presents for radioactive seed localization prior to lumpectomy. I met with the patient and we discussed the procedure of seed localization including benefits and alternatives. We discussed the high likelihood of a successful procedure. We discussed the risks of the procedure including infection, bleeding, tissue injury and further surgery. We discussed the low dose of radioactivity involved in the procedure. Informed, written consent was given. The usual time-out protocol was performed immediately prior to the procedure. Using mammographic guidance, sterile technique, 1% lidocaine and an I-125 radioactive seed, the coil shaped clip within the upper RIGHT breast was localized using a superior approach. The follow-up mammogram images confirm the seed in the expected location and were  marked for Dr. Magnus Ivan. Follow-up survey of the patient confirms presence of the radioactive seed. Order number of I-125 seed:  [id number]. Total activity:  0.260 millicuries reference Date: 10/29/2022 The patient tolerated the procedure well and was [facility] from the [facility]. She was given instructions regarding seed removal. IMPRESSION: Radioactive seed localization right breast. No apparent complications. Electronically Signed   By: Bary Richard M.D.   On: 11/26/2022 14:28   Impression/Plan: This is a delightful 62 year old woman with history of right breast cancer We discussed adjuvant radiotherapy today.  I recommend radiation therapy to the right breast over 4 weeks using standard hypofractionation in order to minimize risk of recurrence in the breast.  I reviewed the logistics, benefits, risks, and potential side effects of this treatment in detail. Risks may include but not necessary be limited to acute and late injury tissue in the radiation fields such as skin irritation (change in color/pigmentation, itching, dryness, pain, peeling). She may experience fatigue. We also discussed possible risk of long term cosmetic changes or scar tissue. There is also a smaller risk for lung toxicity, brachial plexopathy, lymphedema, musculoskeletal changes, rib fragility or induction of a second malignancy, late chronic non-healing soft tissue wound.    The patient asked good questions which I answered to her satisfaction.  She is enthusiastic about proceeding with treatment. A consent form has been signed and placed in her chart.  We will proceed with treatment planning today.  She will continue to follow with physical therapy.  On date of service, in total, I spent 30 minutes on this encounter. Patient was seen in person.  _____________________________________   Lonie Peak, MD  This document serves as a record of services personally performed by Lonie Peak, MD. It was created on her behalf by  Neena Rhymes, a trained medical scribe. The creation of this record is based on the scribe's personal observations and the provider's statements to them. This document has been checked and approved by the attending provider.

## 2022-12-25 NOTE — Telephone Encounter (Signed)
Rn called pt to obtain meaningful use and nurse evaluation information. Consult note complete and sent to Dr. Basilio Cairo. Pt doing well with no concerns.

## 2022-12-25 NOTE — Telephone Encounter (Signed)
Rn attempted to call pt to obtain pre consult information without success. Voicemail was left for pt. Rn will call back later this afternoon.

## 2022-12-26 ENCOUNTER — Ambulatory Visit
Admission: RE | Admit: 2022-12-26 | Discharge: 2022-12-26 | Disposition: A | Discharge status: Home / Self Care | Payer: BC Managed Care – PPO | Source: Ambulatory Visit | Attending: Radiation Oncology | Admitting: Radiation Oncology

## 2022-12-26 VITALS — BP 136/68 | HR 76 | Temp 96.8°F | Resp 18 | Ht 66.0 in | Wt 167.1 lb

## 2022-12-26 DIAGNOSIS — Z170 Estrogen receptor positive status [ER+]: Secondary | ICD-10-CM

## 2022-12-26 DIAGNOSIS — Z923 Personal history of irradiation: Secondary | ICD-10-CM | POA: Insufficient documentation

## 2022-12-26 DIAGNOSIS — C50911 Malignant neoplasm of unspecified site of right female breast: Secondary | ICD-10-CM

## 2022-12-26 DIAGNOSIS — Z510 Encounter for antineoplastic radiation therapy: Secondary | ICD-10-CM | POA: Insufficient documentation

## 2022-12-26 DIAGNOSIS — Z79899 Other long term (current) drug therapy: Secondary | ICD-10-CM | POA: Insufficient documentation

## 2022-12-26 DIAGNOSIS — C50411 Malignant neoplasm of upper-outer quadrant of right female breast: Secondary | ICD-10-CM | POA: Insufficient documentation

## 2022-12-31 DIAGNOSIS — Z170 Estrogen receptor positive status [ER+]: Secondary | ICD-10-CM | POA: Diagnosis not present

## 2022-12-31 DIAGNOSIS — C50411 Malignant neoplasm of upper-outer quadrant of right female breast: Secondary | ICD-10-CM | POA: Diagnosis not present

## 2022-12-31 DIAGNOSIS — Z510 Encounter for antineoplastic radiation therapy: Secondary | ICD-10-CM | POA: Diagnosis not present

## 2023-01-01 ENCOUNTER — Ambulatory Visit: Payer: BC Managed Care – PPO | Attending: Surgery | Admitting: Physical Therapy

## 2023-01-01 ENCOUNTER — Other Ambulatory Visit: Payer: Self-pay

## 2023-01-01 ENCOUNTER — Encounter: Payer: Self-pay | Admitting: Physical Therapy

## 2023-01-01 DIAGNOSIS — C50411 Malignant neoplasm of upper-outer quadrant of right female breast: Secondary | ICD-10-CM | POA: Diagnosis not present

## 2023-01-01 DIAGNOSIS — Z170 Estrogen receptor positive status [ER+]: Secondary | ICD-10-CM

## 2023-01-01 DIAGNOSIS — R293 Abnormal posture: Secondary | ICD-10-CM | POA: Diagnosis not present

## 2023-01-01 DIAGNOSIS — Z483 Aftercare following surgery for neoplasm: Secondary | ICD-10-CM

## 2023-01-01 NOTE — Patient Instructions (Signed)
After Breast Cancer Class It is recommended you attend the ABC class to be educated on lymphedema risk reduction. This class is free of charge and lasts for 1 hour. It is a 1-time class. You will need to download the TEAMS app either on your phone or computer. We will send you a link the night before or the morning of the class. You should be able to click on that link to join the class. This is not a confidential class. You don't have to turn your camera on, but other participants may be able to see your email address. You are scheduled for May 20th at 12:00.  Scar massage You can begin gentle scar massage to you incision sites. Gently place one hand on the incision and move the skin (without sliding on the skin) in various directions. Do this for a few minutes and then you can gently massage either coconut oil or vitamin E cream into the scars.  Compression garment You should continue wearing your compression bra until you feel like you no longer have swelling.  Home exercise Program Continue doing the exercises you were given until you feel like you can do them without feeling any tightness at the end.   Walking Program Studies show that 30 minutes of walking per day (fast enough to elevate your heart rate) can significantly reduce the risk of a cancer recurrence. If you can't walk due to other medical reasons, we encourage you to find another activity you could do (like a stationary bike or water exercise).  Posture After breast cancer surgery, people frequently sit with rounded shoulders posture because it puts their incisions on slack and feels better. If you sit like this and scar tissue forms in that position, you can become very tight and have pain sitting or standing with good posture. Try to be aware of your posture and sit and stand up tall to heal properly.  Follow up PT: It is recommended you return every 3 months for the first 3 years following surgery to be assessed on the SOZO  machine for an L-Dex score. This helps prevent clinically significant lymphedema in 95% of patients. These follow up screens are 10 minute appointments that you are not billed for. You are scheduled for July 8th at 12:20.

## 2023-01-01 NOTE — Therapy (Signed)
OUTPATIENT PHYSICAL THERAPY BREAST CANCER POST OP FOLLOW UP   Patient Name: Connie Alvarez MRN: [id number] DOB:August 22, 1961, 62 y.o., female Today's Date: 01/01/2023  END OF SESSION:  PT End of Session - 01/01/23 1104     Visit Number 2    Number of Visits 2    PT Start Time 1105    PT Stop Time 1144    PT Time Calculation (min) 39 min    Activity Tolerance Patient tolerated treatment well    Behavior During Therapy WFL for tasks assessed/performed             Past Medical History:  Diagnosis Date   Bursitis    Complication of anesthesia    woke up during hysterectomy   COVID 05/2021   Endometriosis    Lichen sclerosus    Past Surgical History:  Procedure Laterality Date   ABDOMINAL HYSTERECTOMY  2002   TAH,BSO/ENDOMETRIOSIS   BREAST BIOPSY Right 10/27/2022   [place] RT BREAST BX W LOC DEV 1ST LESION IMG BX SPEC US GUIDE 10/27/2022 [facility]   BREAST BIOPSY  11/26/2022   MM RT RADIOACTIVE SEED LOC MAMMO GUIDE 11/26/2022 [facility]   BREAST LUMPECTOMY WITH RADIOACTIVE SEED AND SENTINEL LYMPH NODE BIOPSY Right 11/27/2022   Procedure: RIGHT BREAST LUMPECTOMY WITH RADIOACTIVE SEED AND SENTINEL LYMPH NODE BIOPSY;  Surgeon: Abigail Miyamoto, MD;  Location: [facility];  Service: General;  Laterality: Right;   BUNIONECTOMY  1981 AND 1989   '81 RIGHT AND '89 LEFT   cataract surgery     right eye   OOPHORECTOMY     BSO   Patient Active Problem List   Diagnosis Date Noted   Genetic screening 11/13/2022   Invasive ductal carcinoma of right breast, stage 1 (HCC) 11/05/2022   Malignant neoplasm of upper-outer quadrant of right breast in female, estrogen receptor positive (HCC) 11/05/2022   History of total abdominal hysterectomy and bilateral salpingo-oophorectomy 04/11/2021   Endometriosis    Lichen sclerosus    Benign colonic polyp     REFERRING PROVIDER: Dr. Burnice Logan iruku  REFERRING DIAG: Right breast cancer  THERAPY DIAG:  Malignant neoplasm of  upper-outer quadrant of right breast in female, estrogen receptor positive (HCC)  Abnormal posture  Aftercare following surgery for neoplasm  Rationale for Evaluation and Treatment: Rehabilitation  ONSET DATE: 11/27/2022  SUBJECTIVE:                                                                                                                                                                                           SUBJECTIVE STATEMENT: Patient reports she underwent a right lumpectomy and  sentinel node biopsy (2 negative nodes) on 11/27/2022. Her Oncotype score was 25 so she is proceeding to radiation beginning 01/06/2023 and then anti-estrogen therapy.  PERTINENT HISTORY:  Patient was diagnosed on 10/29/2022 with right grade 2 invasive ductal carcinoma breast cancer. It measures 5 mm and is located in the upper outer quadrant. It is ER/PR positive and HER2 negative with a Ki67 of 15%. She had a hysterectomy in 2002.   PATIENT GOALS:  Reassess how my recovery is going related to arm function, pain, and swelling.  PAIN:  Are you having pain? No  PRECAUTIONS: Recent Surgery, right UE Lymphedema risk  ACTIVITY LEVEL / LEISURE: She walking 3x/week for 30 minutes   OBJECTIVE:   PATIENT SURVEYS:   Neldon Mc - 01/01/23 0001     Open a tight or new jar No difficulty    Do heavy household chores (wash walls, wash floors) No difficulty    Carry a shopping bag or briefcase No difficulty    Wash your back No difficulty    Use a knife to cut food No difficulty    Recreational activities in which you take some force or impact through your arm, shoulder, or hand (golf, hammering, tennis) No difficulty    During the past week, to what extent has your arm, shoulder or hand problem interfered with your normal social activities with family, friends, neighbors, or groups? Not at all    During the past week, to what extent has your arm, shoulder or hand problem limited your work or other regular daily  activities Not at all    Arm, shoulder, or hand pain. None    Tingling (pins and needles) in your arm, shoulder, or hand None    Difficulty Sleeping No difficulty    DASH Score 0 %             Quick Dash - 01/01/23 0001     Open a tight or new jar No difficulty    Do heavy household chores (wash walls, wash floors) No difficulty    Carry a shopping bag or briefcase No difficulty    Wash your back No difficulty    Use a knife to cut food No difficulty    Recreational activities in which you take some force or impact through your arm, shoulder, or hand (golf, hammering, tennis) No difficulty    During the past week, to what extent has your arm, shoulder or hand problem interfered with your normal social activities with family, friends, neighbors, or groups? Not at all    During the past week, to what extent has your arm, shoulder or hand problem limited your work or other regular daily activities Not at all    Arm, shoulder, or hand pain. None    Tingling (pins and needles) in your arm, shoulder, or hand None    Difficulty Sleeping No difficulty    DASH Score 0 %              OBSERVATIONS: Right breast incisions are well healed. Mild edema present in right medial inferior breast which is filling the space where her stretch marks are and appears as if there are lines in her breast. No axillary cording or signs of infection noted. Right breast bruising still present which appears to be from her mag trace injection.  POSTURE:  Forward head and rounded shoulders  LYMPHEDEMA ASSESSMENT:   UPPER EXTREMITY AROM/PROM:   A/PROM RIGHT   eval   RIGHT 01/01/2023  Shoulder extension 47 52  Shoulder flexion 164 158  Shoulder abduction 165 173  Shoulder internal rotation 63 54  Shoulder external rotation 90 90                          (Blank rows = not tested)   A/PROM LEFT   eval  Shoulder extension 50  Shoulder flexion 147  Shoulder abduction 178  Shoulder internal rotation  48  Shoulder external rotation 82                          (Blank rows = not tested)   CERVICAL AROM: All within normal limits   UPPER EXTREMITY STRENGTH: WFL   LYMPHEDEMA ASSESSMENTS:    LANDMARK RIGHT   eval RIGHT 01/01/2023  10 cm proximal to olecranon process 29.3 30.3  Olecranon process 25.8 26.3  10 cm proximal to ulnar styloid process 22.7 22.9  Just proximal to ulnar styloid process 15.8 15.2  Across hand at thumb web space 18.1 18.6  At base of 2nd digit 6 6  (Blank rows = not tested)   LANDMARK LEFT   eval LEFT 01/01/2023  10 cm proximal to olecranon process 28.8 29.6  Olecranon process 24.6 24.6  10 cm proximal to ulnar styloid process 21.6 21.4  Just proximal to ulnar styloid process 15.5 15.1  Across hand at thumb web space 17.8 17.5  At base of 2nd digit 5.8 5.7  (Blank rows = not tested)  Surgery type/Date: Right lumpectomy and sentinel node biopsy (2 negative nodes) on 11/27/2022 Number of lymph nodes removed: 2 Current/past treatment (chemo, radiation, hormone therapy): none Other symptoms:  Heaviness/tightness No Pain No Pitting edema No Infections No Decreased scar mobility Yes Stemmer sign No  PATIENT EDUCATION:  Education details: HEP review  Person educated: Patient Education method: Explanation Education comprehension: verbalized understanding  HOME EXERCISE PROGRAM: Reviewed previously given post op HEP.  ASSESSMENT:  CLINICAL IMPRESSION: Patient is doing very well s/p right lumpectomy and sentinel node biopsy on 11/27/2022. She has regained full shoulder ROM, has no pain, and reports no functional deficits. She has some mild edema in her right inferior breast which is exacerbating the appearance of her stretch marks and she had concerns about that. Encouraged her to resume a compression bra for a few hours/day to see if that helps. She plans to attend the ABC class for lymphedema risk reduction education but otherwise has no PT needs at this  time.  Pt will benefit from skilled therapeutic intervention to improve on the following deficits: Decreased knowledge of precautions, impaired UE functional use, pain, decreased ROM, postural dysfunction.   PT treatment/interventions: ADL/Self care home management, Therapeutic exercises, Therapeutic activity, Patient/Family education, Self Care, and Re-evaluation   GOALS: Goals reviewed with patient? Yes  LONG TERM GOALS:  (STG=LTG)  GOALS Name Target Date  Goal status  1 Pt will demonstrate she has regained full shoulder ROM and function post operatively compared to baselines.  Baseline: 12/31/2022 MET     PLAN:  PT FREQUENCY/DURATION: N/A  PLAN FOR NEXT SESSION: D/C   [facility]  [address]  [place] [address]  [phone]  After Breast Cancer Class It is recommended you attend the ABC class to be educated on lymphedema risk reduction. This class is free of charge and lasts for 1 hour. It is a 1-time class. You will need to download the  TEAMS app either on your phone or computer. We will send you a link the night before or the morning of the class. You should be able to click on that link to join the class. This is not a confidential class. You don't have to turn your camera on, but other participants may be able to see your email address. You are scheduled for May 20th at 12:00.  Scar massage You can begin gentle scar massage to you incision sites. Gently place one hand on the incision and move the skin (without sliding on the skin) in various directions. Do this for a few minutes and then you can gently massage either coconut oil or vitamin E cream into the scars.  Compression garment You should continue wearing your compression bra until you feel like you no longer have swelling.  Home exercise Program Continue doing the exercises you were given until you feel like you can do them without feeling any tightness at the end.   Walking  Program Studies show that 30 minutes of walking per day (fast enough to elevate your heart rate) can significantly reduce the risk of a cancer recurrence. If you can't walk due to other medical reasons, we encourage you to find another activity you could do (like a stationary bike or water exercise).  Posture After breast cancer surgery, people frequently sit with rounded shoulders posture because it puts their incisions on slack and feels better. If you sit like this and scar tissue forms in that position, you can become very tight and have pain sitting or standing with good posture. Try to be aware of your posture and sit and stand up tall to heal properly.  Follow up PT: It is recommended you return every 3 months for the first 3 years following surgery to be assessed on the SOZO machine for an L-Dex score. This helps prevent clinically significant lymphedema in 95% of patients. These follow up screens are 10 minute appointments that you are not billed for. You are scheduled for July 8th at 12:20.  PHYSICAL THERAPY DISCHARGE SUMMARY  Visits from Start of Care: 2  Current functional level related to goals / functional outcomes: Goals met. See above for objective measurements.   Remaining deficits: None   Education / Equipment: HEP and lymphedema education   Patient agrees to discharge. Patient goals were met. Patient is being discharged due to meeting the stated rehab goals.  Bethann Punches, [place] 01/01/23 11:58 AM

## 2023-01-02 ENCOUNTER — Encounter: Payer: Self-pay | Admitting: *Deleted

## 2023-01-02 DIAGNOSIS — C50911 Malignant neoplasm of unspecified site of right female breast: Secondary | ICD-10-CM

## 2023-01-05 ENCOUNTER — Telehealth: Payer: Self-pay | Admitting: Adult Health

## 2023-01-06 ENCOUNTER — Ambulatory Visit
Admission: RE | Admit: 2023-01-06 | Discharge: 2023-01-06 | Disposition: A | Discharge status: Home / Self Care | Payer: BC Managed Care – PPO | Source: Ambulatory Visit | Attending: Radiation Oncology | Admitting: Radiation Oncology

## 2023-01-06 ENCOUNTER — Other Ambulatory Visit: Payer: Self-pay

## 2023-01-06 DIAGNOSIS — C50411 Malignant neoplasm of upper-outer quadrant of right female breast: Secondary | ICD-10-CM | POA: Diagnosis not present

## 2023-01-06 DIAGNOSIS — Z510 Encounter for antineoplastic radiation therapy: Secondary | ICD-10-CM | POA: Diagnosis not present

## 2023-01-06 DIAGNOSIS — Z170 Estrogen receptor positive status [ER+]: Secondary | ICD-10-CM | POA: Diagnosis not present

## 2023-01-06 LAB — RAD ONC ARIA SESSION SUMMARY
Course Elapsed Days: 0
Plan Fractions Treated to Date: 1
Plan Prescribed Dose Per Fraction: 2.67 Gy
Plan Total Fractions Prescribed: 15
Plan Total Prescribed Dose: 40.05 Gy
Reference Point Dosage Given to Date: 2.67 Gy
Reference Point Session Dosage Given: 2.67 Gy
Session Number: 1

## 2023-01-07 ENCOUNTER — Other Ambulatory Visit: Payer: Self-pay

## 2023-01-07 ENCOUNTER — Ambulatory Visit
Admission: RE | Admit: 2023-01-07 | Discharge: 2023-01-07 | Disposition: A | Discharge status: Home / Self Care | Payer: BC Managed Care – PPO | Source: Ambulatory Visit | Attending: Radiation Oncology | Admitting: Radiation Oncology

## 2023-01-07 DIAGNOSIS — C50411 Malignant neoplasm of upper-outer quadrant of right female breast: Secondary | ICD-10-CM | POA: Diagnosis not present

## 2023-01-07 DIAGNOSIS — Z510 Encounter for antineoplastic radiation therapy: Secondary | ICD-10-CM | POA: Diagnosis not present

## 2023-01-07 DIAGNOSIS — Z170 Estrogen receptor positive status [ER+]: Secondary | ICD-10-CM | POA: Diagnosis not present

## 2023-01-07 LAB — RAD ONC ARIA SESSION SUMMARY
Course Elapsed Days: 1
Plan Fractions Treated to Date: 2
Plan Prescribed Dose Per Fraction: 2.67 Gy
Plan Total Fractions Prescribed: 15
Plan Total Prescribed Dose: 40.05 Gy
Reference Point Dosage Given to Date: 5.34 Gy
Reference Point Session Dosage Given: 2.67 Gy
Session Number: 2

## 2023-01-08 ENCOUNTER — Other Ambulatory Visit: Payer: Self-pay

## 2023-01-08 ENCOUNTER — Ambulatory Visit
Admission: RE | Admit: 2023-01-08 | Discharge: 2023-01-08 | Disposition: A | Discharge status: Home / Self Care | Payer: BC Managed Care – PPO | Source: Ambulatory Visit | Attending: Radiation Oncology | Admitting: Radiation Oncology

## 2023-01-08 DIAGNOSIS — C50411 Malignant neoplasm of upper-outer quadrant of right female breast: Secondary | ICD-10-CM | POA: Diagnosis not present

## 2023-01-08 DIAGNOSIS — Z170 Estrogen receptor positive status [ER+]: Secondary | ICD-10-CM | POA: Diagnosis not present

## 2023-01-08 DIAGNOSIS — Z510 Encounter for antineoplastic radiation therapy: Secondary | ICD-10-CM | POA: Diagnosis not present

## 2023-01-08 LAB — RAD ONC ARIA SESSION SUMMARY
Course Elapsed Days: 2
Plan Fractions Treated to Date: 3
Plan Prescribed Dose Per Fraction: 2.67 Gy
Plan Total Fractions Prescribed: 15
Plan Total Prescribed Dose: 40.05 Gy
Reference Point Dosage Given to Date: 8.01 Gy
Reference Point Session Dosage Given: 2.67 Gy
Session Number: 3

## 2023-01-09 ENCOUNTER — Ambulatory Visit
Admission: RE | Admit: 2023-01-09 | Discharge: 2023-01-09 | Disposition: A | Discharge status: Home / Self Care | Payer: BC Managed Care – PPO | Source: Ambulatory Visit | Attending: Radiation Oncology | Admitting: Radiation Oncology

## 2023-01-09 ENCOUNTER — Other Ambulatory Visit: Payer: Self-pay

## 2023-01-09 DIAGNOSIS — Z510 Encounter for antineoplastic radiation therapy: Secondary | ICD-10-CM | POA: Diagnosis not present

## 2023-01-09 DIAGNOSIS — Z170 Estrogen receptor positive status [ER+]: Secondary | ICD-10-CM | POA: Diagnosis not present

## 2023-01-09 DIAGNOSIS — C50411 Malignant neoplasm of upper-outer quadrant of right female breast: Secondary | ICD-10-CM | POA: Diagnosis not present

## 2023-01-09 LAB — RAD ONC ARIA SESSION SUMMARY
Course Elapsed Days: 3
Plan Fractions Treated to Date: 4
Plan Prescribed Dose Per Fraction: 2.67 Gy
Plan Total Fractions Prescribed: 15
Plan Total Prescribed Dose: 40.05 Gy
Reference Point Dosage Given to Date: 10.68 Gy
Reference Point Session Dosage Given: 2.67 Gy
Session Number: 4

## 2023-01-12 ENCOUNTER — Ambulatory Visit
Admission: RE | Admit: 2023-01-12 | Discharge: 2023-01-12 | Disposition: A | Discharge status: Home / Self Care | Payer: BC Managed Care – PPO | Source: Ambulatory Visit | Attending: Radiation Oncology | Admitting: Radiation Oncology

## 2023-01-12 ENCOUNTER — Telehealth: Payer: Self-pay | Admitting: Hematology and Oncology

## 2023-01-12 ENCOUNTER — Other Ambulatory Visit: Payer: Self-pay

## 2023-01-12 DIAGNOSIS — Z170 Estrogen receptor positive status [ER+]: Secondary | ICD-10-CM

## 2023-01-12 DIAGNOSIS — C50411 Malignant neoplasm of upper-outer quadrant of right female breast: Secondary | ICD-10-CM

## 2023-01-12 DIAGNOSIS — Z510 Encounter for antineoplastic radiation therapy: Secondary | ICD-10-CM | POA: Diagnosis not present

## 2023-01-12 LAB — RAD ONC ARIA SESSION SUMMARY
Course Elapsed Days: 6
Plan Fractions Treated to Date: 5
Plan Prescribed Dose Per Fraction: 2.67 Gy
Plan Total Fractions Prescribed: 15
Plan Total Prescribed Dose: 40.05 Gy
Reference Point Dosage Given to Date: 13.35 Gy
Reference Point Session Dosage Given: 2.67 Gy
Session Number: 5

## 2023-01-12 MED ORDER — RADIAPLEXRX EX GEL: Freq: Once | CUTANEOUS | Status: AC

## 2023-01-12 MED ORDER — ALRA NON-METALLIC DEODORANT (RAD-ONC)
1.0000 | Freq: Once | TOPICAL | Status: AC
  Administered 2023-01-12: 1 via TOPICAL

## 2023-01-12 NOTE — Telephone Encounter (Signed)
Spoke with patient confirming upcoming appointment  

## 2023-01-13 ENCOUNTER — Ambulatory Visit
Admission: RE | Admit: 2023-01-13 | Discharge: 2023-01-13 | Disposition: A | Discharge status: Home / Self Care | Payer: BC Managed Care – PPO | Source: Ambulatory Visit | Attending: Radiation Oncology | Admitting: Radiation Oncology

## 2023-01-13 ENCOUNTER — Other Ambulatory Visit: Payer: Self-pay

## 2023-01-13 DIAGNOSIS — Z510 Encounter for antineoplastic radiation therapy: Secondary | ICD-10-CM | POA: Diagnosis not present

## 2023-01-13 DIAGNOSIS — C50411 Malignant neoplasm of upper-outer quadrant of right female breast: Secondary | ICD-10-CM | POA: Diagnosis not present

## 2023-01-13 DIAGNOSIS — Z170 Estrogen receptor positive status [ER+]: Secondary | ICD-10-CM | POA: Diagnosis not present

## 2023-01-13 LAB — RAD ONC ARIA SESSION SUMMARY
Course Elapsed Days: 7
Plan Fractions Treated to Date: 6
Plan Prescribed Dose Per Fraction: 2.67 Gy
Plan Total Fractions Prescribed: 15
Plan Total Prescribed Dose: 40.05 Gy
Reference Point Dosage Given to Date: 16.02 Gy
Reference Point Session Dosage Given: 2.67 Gy
Session Number: 6

## 2023-01-14 ENCOUNTER — Ambulatory Visit
Admission: RE | Admit: 2023-01-14 | Discharge: 2023-01-14 | Disposition: A | Discharge status: Home / Self Care | Payer: BC Managed Care – PPO | Source: Ambulatory Visit | Attending: Radiation Oncology | Admitting: Radiation Oncology

## 2023-01-14 ENCOUNTER — Other Ambulatory Visit: Payer: Self-pay

## 2023-01-14 DIAGNOSIS — Z510 Encounter for antineoplastic radiation therapy: Secondary | ICD-10-CM | POA: Diagnosis not present

## 2023-01-14 DIAGNOSIS — Z170 Estrogen receptor positive status [ER+]: Secondary | ICD-10-CM | POA: Diagnosis not present

## 2023-01-14 DIAGNOSIS — C50411 Malignant neoplasm of upper-outer quadrant of right female breast: Secondary | ICD-10-CM | POA: Diagnosis not present

## 2023-01-14 LAB — RAD ONC ARIA SESSION SUMMARY
Course Elapsed Days: 8
Plan Fractions Treated to Date: 7
Plan Prescribed Dose Per Fraction: 2.67 Gy
Plan Total Fractions Prescribed: 15
Plan Total Prescribed Dose: 40.05 Gy
Reference Point Dosage Given to Date: 18.69 Gy
Reference Point Session Dosage Given: 2.67 Gy
Session Number: 7

## 2023-01-15 ENCOUNTER — Other Ambulatory Visit: Payer: Self-pay

## 2023-01-15 ENCOUNTER — Ambulatory Visit
Admission: RE | Admit: 2023-01-15 | Discharge: 2023-01-15 | Disposition: A | Discharge status: Home / Self Care | Payer: BC Managed Care – PPO | Source: Ambulatory Visit | Attending: Radiation Oncology | Admitting: Radiation Oncology

## 2023-01-15 DIAGNOSIS — Z510 Encounter for antineoplastic radiation therapy: Secondary | ICD-10-CM | POA: Diagnosis not present

## 2023-01-15 DIAGNOSIS — C50411 Malignant neoplasm of upper-outer quadrant of right female breast: Secondary | ICD-10-CM | POA: Diagnosis not present

## 2023-01-15 DIAGNOSIS — Z170 Estrogen receptor positive status [ER+]: Secondary | ICD-10-CM | POA: Diagnosis not present

## 2023-01-15 LAB — RAD ONC ARIA SESSION SUMMARY
Course Elapsed Days: 9
Plan Fractions Treated to Date: 8
Plan Prescribed Dose Per Fraction: 2.67 Gy
Plan Total Fractions Prescribed: 15
Plan Total Prescribed Dose: 40.05 Gy
Reference Point Dosage Given to Date: 21.36 Gy
Reference Point Session Dosage Given: 2.67 Gy
Session Number: 8

## 2023-01-16 ENCOUNTER — Ambulatory Visit
Admission: RE | Admit: 2023-01-16 | Discharge: 2023-01-16 | Disposition: A | Discharge status: Home / Self Care | Payer: BC Managed Care – PPO | Source: Ambulatory Visit | Attending: Radiation Oncology | Admitting: Radiation Oncology

## 2023-01-16 ENCOUNTER — Other Ambulatory Visit: Payer: Self-pay

## 2023-01-16 DIAGNOSIS — Z510 Encounter for antineoplastic radiation therapy: Secondary | ICD-10-CM | POA: Diagnosis not present

## 2023-01-16 DIAGNOSIS — C50411 Malignant neoplasm of upper-outer quadrant of right female breast: Secondary | ICD-10-CM | POA: Diagnosis not present

## 2023-01-16 LAB — RAD ONC ARIA SESSION SUMMARY
Course Elapsed Days: 10
Plan Fractions Treated to Date: 9
Plan Prescribed Dose Per Fraction: 2.67 Gy
Plan Total Fractions Prescribed: 15
Plan Total Prescribed Dose: 40.05 Gy
Reference Point Dosage Given to Date: 24.03 Gy
Reference Point Session Dosage Given: 2.67 Gy
Session Number: 9

## 2023-01-20 ENCOUNTER — Other Ambulatory Visit: Payer: Self-pay

## 2023-01-20 ENCOUNTER — Ambulatory Visit
Admission: RE | Admit: 2023-01-20 | Discharge: 2023-01-20 | Disposition: A | Discharge status: Home / Self Care | Payer: BC Managed Care – PPO | Source: Ambulatory Visit | Attending: Radiation Oncology | Admitting: Radiation Oncology

## 2023-01-20 DIAGNOSIS — C50411 Malignant neoplasm of upper-outer quadrant of right female breast: Secondary | ICD-10-CM | POA: Diagnosis not present

## 2023-01-20 DIAGNOSIS — Z510 Encounter for antineoplastic radiation therapy: Secondary | ICD-10-CM | POA: Diagnosis not present

## 2023-01-20 DIAGNOSIS — Z170 Estrogen receptor positive status [ER+]: Secondary | ICD-10-CM | POA: Diagnosis not present

## 2023-01-20 LAB — RAD ONC ARIA SESSION SUMMARY
Course Elapsed Days: 14
Plan Fractions Treated to Date: 10
Plan Prescribed Dose Per Fraction: 2.67 Gy
Plan Total Fractions Prescribed: 15
Plan Total Prescribed Dose: 40.05 Gy
Reference Point Dosage Given to Date: 26.7 Gy
Reference Point Session Dosage Given: 2.67 Gy
Session Number: 10

## 2023-01-21 ENCOUNTER — Other Ambulatory Visit: Payer: Self-pay

## 2023-01-21 ENCOUNTER — Ambulatory Visit
Admission: RE | Admit: 2023-01-21 | Discharge: 2023-01-21 | Disposition: A | Discharge status: Home / Self Care | Payer: BC Managed Care – PPO | Source: Ambulatory Visit | Attending: Radiation Oncology | Admitting: Radiation Oncology

## 2023-01-21 DIAGNOSIS — C50411 Malignant neoplasm of upper-outer quadrant of right female breast: Secondary | ICD-10-CM | POA: Diagnosis not present

## 2023-01-21 DIAGNOSIS — Z510 Encounter for antineoplastic radiation therapy: Secondary | ICD-10-CM | POA: Diagnosis not present

## 2023-01-21 LAB — RAD ONC ARIA SESSION SUMMARY
Course Elapsed Days: 15
Plan Fractions Treated to Date: 11
Plan Prescribed Dose Per Fraction: 2.67 Gy
Plan Total Fractions Prescribed: 15
Plan Total Prescribed Dose: 40.05 Gy
Reference Point Dosage Given to Date: 29.37 Gy
Reference Point Session Dosage Given: 2.67 Gy
Session Number: 11

## 2023-01-22 ENCOUNTER — Ambulatory Visit
Admission: RE | Admit: 2023-01-22 | Discharge: 2023-01-22 | Disposition: A | Discharge status: Home / Self Care | Payer: BC Managed Care – PPO | Source: Ambulatory Visit | Attending: Radiation Oncology | Admitting: Radiation Oncology

## 2023-01-22 ENCOUNTER — Other Ambulatory Visit: Payer: Self-pay

## 2023-01-22 DIAGNOSIS — Z170 Estrogen receptor positive status [ER+]: Secondary | ICD-10-CM | POA: Diagnosis not present

## 2023-01-22 DIAGNOSIS — Z510 Encounter for antineoplastic radiation therapy: Secondary | ICD-10-CM | POA: Diagnosis not present

## 2023-01-22 DIAGNOSIS — C50411 Malignant neoplasm of upper-outer quadrant of right female breast: Secondary | ICD-10-CM | POA: Diagnosis not present

## 2023-01-22 LAB — RAD ONC ARIA SESSION SUMMARY
Course Elapsed Days: 16
Plan Fractions Treated to Date: 12
Plan Prescribed Dose Per Fraction: 2.67 Gy
Plan Total Fractions Prescribed: 15
Plan Total Prescribed Dose: 40.05 Gy
Reference Point Dosage Given to Date: 32.04 Gy
Reference Point Session Dosage Given: 2.67 Gy
Session Number: 12

## 2023-01-23 ENCOUNTER — Ambulatory Visit
Admission: RE | Admit: 2023-01-23 | Discharge: 2023-01-23 | Disposition: A | Discharge status: Home / Self Care | Payer: BC Managed Care – PPO | Source: Ambulatory Visit | Attending: Radiation Oncology | Admitting: Radiation Oncology

## 2023-01-23 ENCOUNTER — Other Ambulatory Visit: Payer: Self-pay

## 2023-01-23 DIAGNOSIS — Z510 Encounter for antineoplastic radiation therapy: Secondary | ICD-10-CM | POA: Diagnosis not present

## 2023-01-23 DIAGNOSIS — C50411 Malignant neoplasm of upper-outer quadrant of right female breast: Secondary | ICD-10-CM | POA: Diagnosis not present

## 2023-01-23 DIAGNOSIS — Z170 Estrogen receptor positive status [ER+]: Secondary | ICD-10-CM | POA: Diagnosis not present

## 2023-01-23 LAB — RAD ONC ARIA SESSION SUMMARY
Course Elapsed Days: 17
Plan Fractions Treated to Date: 13
Plan Prescribed Dose Per Fraction: 2.67 Gy
Plan Total Fractions Prescribed: 15
Plan Total Prescribed Dose: 40.05 Gy
Reference Point Dosage Given to Date: 34.71 Gy
Reference Point Session Dosage Given: 2.67 Gy
Session Number: 13

## 2023-01-26 ENCOUNTER — Ambulatory Visit
Admission: RE | Admit: 2023-01-26 | Discharge: 2023-01-26 | Disposition: A | Discharge status: Home / Self Care | Payer: BC Managed Care – PPO | Source: Ambulatory Visit | Attending: Radiation Oncology | Admitting: Radiation Oncology

## 2023-01-26 ENCOUNTER — Other Ambulatory Visit: Payer: Self-pay

## 2023-01-26 ENCOUNTER — Ambulatory Visit: Payer: BC Managed Care – PPO | Admitting: Radiation Oncology

## 2023-01-26 DIAGNOSIS — Z510 Encounter for antineoplastic radiation therapy: Secondary | ICD-10-CM | POA: Insufficient documentation

## 2023-01-26 DIAGNOSIS — C50411 Malignant neoplasm of upper-outer quadrant of right female breast: Secondary | ICD-10-CM | POA: Insufficient documentation

## 2023-01-26 DIAGNOSIS — Z170 Estrogen receptor positive status [ER+]: Secondary | ICD-10-CM | POA: Diagnosis not present

## 2023-01-26 LAB — RAD ONC ARIA SESSION SUMMARY
Course Elapsed Days: 20
Plan Fractions Treated to Date: 14
Plan Prescribed Dose Per Fraction: 2.67 Gy
Plan Total Fractions Prescribed: 15
Plan Total Prescribed Dose: 40.05 Gy
Reference Point Dosage Given to Date: 37.38 Gy
Reference Point Session Dosage Given: 2.67 Gy
Session Number: 14

## 2023-01-27 ENCOUNTER — Other Ambulatory Visit: Payer: Self-pay

## 2023-01-27 ENCOUNTER — Ambulatory Visit
Admission: RE | Admit: 2023-01-27 | Discharge: 2023-01-27 | Disposition: A | Discharge status: Home / Self Care | Payer: BC Managed Care – PPO | Source: Ambulatory Visit | Attending: Radiation Oncology | Admitting: Radiation Oncology

## 2023-01-27 DIAGNOSIS — Z510 Encounter for antineoplastic radiation therapy: Secondary | ICD-10-CM | POA: Diagnosis not present

## 2023-01-27 DIAGNOSIS — C50411 Malignant neoplasm of upper-outer quadrant of right female breast: Secondary | ICD-10-CM | POA: Diagnosis not present

## 2023-01-27 DIAGNOSIS — Z170 Estrogen receptor positive status [ER+]: Secondary | ICD-10-CM | POA: Diagnosis not present

## 2023-01-27 LAB — RAD ONC ARIA SESSION SUMMARY
Course Elapsed Days: 21
Plan Fractions Treated to Date: 15
Plan Prescribed Dose Per Fraction: 2.67 Gy
Plan Total Fractions Prescribed: 15
Plan Total Prescribed Dose: 40.05 Gy
Reference Point Dosage Given to Date: 40.05 Gy
Reference Point Session Dosage Given: 2.67 Gy
Session Number: 15

## 2023-01-28 ENCOUNTER — Other Ambulatory Visit: Payer: Self-pay

## 2023-01-28 ENCOUNTER — Ambulatory Visit
Admission: RE | Admit: 2023-01-28 | Discharge: 2023-01-28 | Disposition: A | Discharge status: Home / Self Care | Payer: BC Managed Care – PPO | Source: Ambulatory Visit | Attending: Radiation Oncology | Admitting: Radiation Oncology

## 2023-01-28 DIAGNOSIS — C50411 Malignant neoplasm of upper-outer quadrant of right female breast: Secondary | ICD-10-CM | POA: Diagnosis not present

## 2023-01-28 DIAGNOSIS — Z170 Estrogen receptor positive status [ER+]: Secondary | ICD-10-CM | POA: Diagnosis not present

## 2023-01-28 DIAGNOSIS — Z510 Encounter for antineoplastic radiation therapy: Secondary | ICD-10-CM | POA: Diagnosis not present

## 2023-01-28 LAB — RAD ONC ARIA SESSION SUMMARY
Course Elapsed Days: 22
Plan Fractions Treated to Date: 1
Plan Prescribed Dose Per Fraction: 2 Gy
Plan Total Fractions Prescribed: 5
Plan Total Prescribed Dose: 10 Gy
Reference Point Dosage Given to Date: 2 Gy
Reference Point Session Dosage Given: 2 Gy
Session Number: 16

## 2023-01-29 ENCOUNTER — Other Ambulatory Visit: Payer: Self-pay

## 2023-01-29 ENCOUNTER — Ambulatory Visit
Admission: RE | Admit: 2023-01-29 | Discharge: 2023-01-29 | Disposition: A | Discharge status: Home / Self Care | Payer: BC Managed Care – PPO | Source: Ambulatory Visit | Attending: Radiation Oncology | Admitting: Radiation Oncology

## 2023-01-29 DIAGNOSIS — Z510 Encounter for antineoplastic radiation therapy: Secondary | ICD-10-CM | POA: Diagnosis not present

## 2023-01-29 DIAGNOSIS — C50411 Malignant neoplasm of upper-outer quadrant of right female breast: Secondary | ICD-10-CM | POA: Diagnosis not present

## 2023-01-29 LAB — RAD ONC ARIA SESSION SUMMARY
Course Elapsed Days: 23
Plan Fractions Treated to Date: 2
Plan Prescribed Dose Per Fraction: 2 Gy
Plan Total Fractions Prescribed: 5
Plan Total Prescribed Dose: 10 Gy
Reference Point Dosage Given to Date: 4 Gy
Reference Point Session Dosage Given: 2 Gy
Session Number: 17

## 2023-01-30 ENCOUNTER — Ambulatory Visit: Payer: BC Managed Care – PPO | Admitting: Hematology and Oncology

## 2023-01-30 ENCOUNTER — Ambulatory Visit
Admission: RE | Admit: 2023-01-30 | Discharge: 2023-01-30 | Disposition: A | Discharge status: Home / Self Care | Payer: BC Managed Care – PPO | Source: Ambulatory Visit | Attending: Radiation Oncology | Admitting: Radiation Oncology

## 2023-01-30 ENCOUNTER — Other Ambulatory Visit: Payer: Self-pay

## 2023-01-30 DIAGNOSIS — C50411 Malignant neoplasm of upper-outer quadrant of right female breast: Secondary | ICD-10-CM | POA: Diagnosis not present

## 2023-01-30 DIAGNOSIS — Z510 Encounter for antineoplastic radiation therapy: Secondary | ICD-10-CM | POA: Diagnosis not present

## 2023-01-30 LAB — RAD ONC ARIA SESSION SUMMARY
Course Elapsed Days: 24
Plan Fractions Treated to Date: 3
Plan Prescribed Dose Per Fraction: 2 Gy
Plan Total Fractions Prescribed: 5
Plan Total Prescribed Dose: 10 Gy
Reference Point Dosage Given to Date: 6 Gy
Reference Point Session Dosage Given: 2 Gy
Session Number: 18

## 2023-02-02 ENCOUNTER — Other Ambulatory Visit: Payer: Self-pay

## 2023-02-02 ENCOUNTER — Ambulatory Visit
Admission: RE | Admit: 2023-02-02 | Discharge: 2023-02-02 | Disposition: A | Discharge status: Home / Self Care | Payer: BC Managed Care – PPO | Source: Ambulatory Visit | Attending: Radiation Oncology | Admitting: Radiation Oncology

## 2023-02-02 DIAGNOSIS — Z510 Encounter for antineoplastic radiation therapy: Secondary | ICD-10-CM | POA: Diagnosis not present

## 2023-02-02 DIAGNOSIS — C50411 Malignant neoplasm of upper-outer quadrant of right female breast: Secondary | ICD-10-CM | POA: Diagnosis not present

## 2023-02-02 LAB — RAD ONC ARIA SESSION SUMMARY
Course Elapsed Days: 27
Plan Fractions Treated to Date: 4
Plan Prescribed Dose Per Fraction: 2 Gy
Plan Total Fractions Prescribed: 5
Plan Total Prescribed Dose: 10 Gy
Reference Point Dosage Given to Date: 8 Gy
Reference Point Session Dosage Given: 2 Gy
Session Number: 19

## 2023-02-03 ENCOUNTER — Other Ambulatory Visit: Payer: Self-pay

## 2023-02-03 ENCOUNTER — Ambulatory Visit
Admission: RE | Admit: 2023-02-03 | Discharge: 2023-02-03 | Disposition: A | Discharge status: Home / Self Care | Payer: BC Managed Care – PPO | Source: Ambulatory Visit | Attending: Radiation Oncology | Admitting: Radiation Oncology

## 2023-02-03 DIAGNOSIS — S4992XA Unspecified injury of left shoulder and upper arm, initial encounter: Secondary | ICD-10-CM | POA: Diagnosis not present

## 2023-02-03 DIAGNOSIS — Z510 Encounter for antineoplastic radiation therapy: Secondary | ICD-10-CM | POA: Diagnosis not present

## 2023-02-03 DIAGNOSIS — C50411 Malignant neoplasm of upper-outer quadrant of right female breast: Secondary | ICD-10-CM | POA: Diagnosis not present

## 2023-02-03 DIAGNOSIS — Z170 Estrogen receptor positive status [ER+]: Secondary | ICD-10-CM | POA: Diagnosis not present

## 2023-02-03 LAB — RAD ONC ARIA SESSION SUMMARY
Course Elapsed Days: 28
Plan Fractions Treated to Date: 5
Plan Prescribed Dose Per Fraction: 2 Gy
Plan Total Fractions Prescribed: 5
Plan Total Prescribed Dose: 10 Gy
Reference Point Dosage Given to Date: 10 Gy
Reference Point Session Dosage Given: 2 Gy
Session Number: 20

## 2023-02-06 NOTE — Radiation Completion Notes (Signed)
Patient Name: Connie Alvarez, KOSMAN MRN: [id number] Date of Birth: 03-23-1961 Referring Physician: Gildardo Cranker, M.D. Date of Service: 2023-02-06 Radiation Oncologist: Lonie Peak, M.D. [facility]                             RADIATION ONCOLOGY END OF TREATMENT NOTE     Diagnosis: C50.411 Malignant neoplasm of upper-outer quadrant of right female breast Staging on 2022-11-05: Invasive ductal carcinoma of right breast, stage 1 (HCC) T=cT1b, N=cN0, M=cM0 Intent: Curative     ==========DELIVERED PLANS==========  First Treatment Date: 2023-01-06 - Last Treatment Date: 2023-02-03   Plan Name: Breast_R Site: Breast, Right Technique: 3D Mode: Photon Dose Per Fraction: 2.67 Gy Prescribed Dose (Delivered / Prescribed): 40.05 Gy / 40.05 Gy Prescribed Fxs (Delivered / Prescribed): 15 / 15   Plan Name: Breast_R_Bst Site: Breast, Right Technique: 3D Mode: Photon Dose Per Fraction: 2 Gy Prescribed Dose (Delivered / Prescribed): 10 Gy / 10 Gy Prescribed Fxs (Delivered / Prescribed): 5 / 5     ==========ON TREATMENT VISIT DATES========== 2023-01-12, 2023-01-20, 2023-01-26, 2023-01-30     ==========UPCOMING VISITS==========       ==========APPENDIX - ON TREATMENT VISIT NOTES==========   See weekly On Treatment Notes in Epic for details.

## 2023-02-10 ENCOUNTER — Other Ambulatory Visit: Payer: Self-pay

## 2023-02-10 ENCOUNTER — Inpatient Hospital Stay: Payer: BC Managed Care – PPO | Attending: Hematology and Oncology | Admitting: Hematology and Oncology

## 2023-02-10 VITALS — BP 124/71 | HR 88 | Temp 97.7°F | Resp 16 | Wt 169.4 lb

## 2023-02-10 DIAGNOSIS — C50411 Malignant neoplasm of upper-outer quadrant of right female breast: Secondary | ICD-10-CM | POA: Insufficient documentation

## 2023-02-10 DIAGNOSIS — Z923 Personal history of irradiation: Secondary | ICD-10-CM | POA: Diagnosis not present

## 2023-02-10 DIAGNOSIS — C50911 Malignant neoplasm of unspecified site of right female breast: Secondary | ICD-10-CM | POA: Diagnosis not present

## 2023-02-10 DIAGNOSIS — Z170 Estrogen receptor positive status [ER+]: Secondary | ICD-10-CM | POA: Insufficient documentation

## 2023-02-10 MED ORDER — ANASTROZOLE 1 MG PO TABS: 1.0000 mg | ORAL_TABLET | Freq: Every day | ORAL | 3 refills | Status: DC

## 2023-02-10 NOTE — Progress Notes (Signed)
[facility] CONSULT NOTE  Patient Care Team: Daisy Floro, MD as PCP - General (Family Medicine) Olivia Mackie, NP as Nurse Practitioner (Gynecology) Abigail Miyamoto, MD as Consulting Physician (General Surgery) Lonie Peak, MD as Attending Physician (Radiation Oncology) Rachel Moulds, MD as Consulting Physician (Hematology and Oncology) Pershing Proud, RN as Oncology Nurse Navigator Donnelly Angelica, RN as Oncology Nurse Navigator  CHIEF COMPLAINTS/PURPOSE OF CONSULTATION:  Right sided breast cancer  HISTORY OF PRESENTING ILLNESS:  Connie Alvarez 62 y.o. female is here because of recent diagnosis of right breast cancer  I reviewed her records extensively and collaborated the history with the patient.  SUMMARY OF ONCOLOGIC HISTORY: Oncology History  Invasive ductal carcinoma of right breast, stage 1 (HCC)  10/06/2022 Mammogram   Mammogram showed possible distortion in the right breast warranting further evaluation.  Diagnostic mammogram showed a 7 mm mass in the 12 o'clock position of the right breast with imaging features suspicious for malignancy including associated focal distortion.  Ultrasound done on February 26 confirmed the same findings.   10/27/2022 Pathology Results   Pathology from the right breast needle core biopsy showed invasive moderately differentiated ductal carcinoma, grade 2, DCIS intermediate nuclear grade, negative for angiolymphatic invasion negative for micro calcs, invasive tumor measuring 4 mm in greatest linear extent, ER 100% positive strong staining PR 30% positive strong staining Ki-67 15% and group 5 HER2 negative   11/05/2022 Initial Diagnosis   Invasive ductal carcinoma of right breast, stage 1 (HCC)   11/11/2022 Genetic Testing   Negative [facility] Common Hereditary Cancers +RNA + MITF +POT1.  VUS in PDGFRA at [id number]>G [facility]).  Report date is 11/11/2022.    The [employer] Common Hereditary Cancers + RNA +MITF + POT1  Panel includes sequencing, deletion/duplication, and RNA analysis of the following 48 genes: APC, ATM, AXIN2, BAP1, BARD1, BMPR1A, BRCA1, BRCA2, BRIP1, CDH1, CDK4*, CDKN2A*, CHEK2, CTNNA1, DICER1, EPCAM* (del/dup only), FH, GREM1* (promoter dup analysis only), HOXB13*, KIT*, MBD4*, MITF, MEN1, MLH1, MSH2, MSH3, MSH6, MUTYH, NF1, NTHL1, PALB2, PDGFRA*, PMS2, POLD1, POLE, POT1, PTEN, RAD51C, RAD51D, SDHA (sequencing only), SDHB, SDHC, SDHD, SMAD4, SMARCA4, STK11, TP53, TSC1, TSC2, VHL.  *Genes without RNA analysis.    Malignant neoplasm of upper-outer quadrant of right breast in female, estrogen receptor positive (HCC)  11/05/2022 Initial Diagnosis   Malignant neoplasm of upper-outer quadrant of right breast in female, estrogen receptor positive (HCC)    Patient arrived to the appointment with Mr Luttman. Since her last visit here, she had right breast lumpectomy, 9 mm grade 2 IDC, DCIS intermediate grade, resection margins neg ER 100% positive strong staining intensity PR 30% positive strong staining intensity HER2 negative and Ki-67 of 15%. Oncotype DX of 25, benefit of chemotherapy less than 1%. She is now s/p adjuvant radiation and is here to discuss anti estrogen therapy. She is doing really well, had some fatigue at the end of radiation and some skin changes but overall very mild and tolerable.  Rest of the pertinent 10 point ROS reviewed and negative  MEDICAL HISTORY:  Past Medical History:  Diagnosis Date   Bursitis    Complication of anesthesia    woke up during hysterectomy   COVID 05/2021   Endometriosis    Lichen sclerosus     SURGICAL HISTORY: Past Surgical History:  Procedure Laterality Date   ABDOMINAL HYSTERECTOMY  2002   TAH,BSO/ENDOMETRIOSIS   BREAST BIOPSY Right 10/27/2022   [place] RT BREAST BX W LOC DEV 1ST LESION  IMG BX SPEC US GUIDE 10/27/2022 [facility]   BREAST BIOPSY  11/26/2022   MM RT RADIOACTIVE SEED LOC MAMMO GUIDE 11/26/2022 [facility]   BREAST  LUMPECTOMY WITH RADIOACTIVE SEED AND SENTINEL LYMPH NODE BIOPSY Right 11/27/2022   Procedure: RIGHT BREAST LUMPECTOMY WITH RADIOACTIVE SEED AND SENTINEL LYMPH NODE BIOPSY;  Surgeon: Abigail Miyamoto, MD;  Location: [facility];  Service: General;  Laterality: Right;   BUNIONECTOMY  1981 AND 1989   '81 RIGHT AND '89 LEFT   cataract surgery     right eye   OOPHORECTOMY     BSO    SOCIAL HISTORY: Social History   Socioeconomic History   Marital status: Married    Spouse name: Not on file   Number of children: Not on file   Years of education: Not on file   Highest education level: Not on file  Occupational History   Not on file  Tobacco Use   Smoking status: Never   Smokeless tobacco: Never  Vaping Use   Vaping Use: Never used  Substance and Sexual Activity   Alcohol use: Yes    Alcohol/week: 0.0 standard drinks of alcohol    Comment: rare   Drug use: No   Sexual activity: Yes    Birth control/protection: Surgical    Comment: INTERCOURSE AGE 39, SEXUAL PARTNERS LESS THAN 5  Other Topics Concern   Not on file  Social History Narrative   Not on file   Social Determinants of Health   Financial Resource Strain: Low Risk  (11/05/2022)   Overall Financial Resource Strain (CARDIA)    Difficulty of Paying Living Expenses: Not hard at all  Food Insecurity: No Food Insecurity (11/05/2022)   Hunger Vital Sign    Worried About Running Out of Food in the Last Year: Never true    Ran Out of Food in the Last Year: Never true  Transportation Needs: No Transportation Needs (11/05/2022)   PRAPARE - Administrator, Civil Service (Medical): No    Lack of Transportation (Non-Medical): No  Physical Activity: Not on file  Stress: Not on file  Social Connections: Not on file  Intimate Partner Violence: Not on file    FAMILY HISTORY: Family History  Problem Relation Age of Onset   Diabetes Mother    Hypertension Mother    Diabetes Sister    Uterine cancer  Sister        dx late 35s   Breast cancer Maternal Aunt        dx 70s-80s   Melanoma Maternal Aunt        d. ~70; mets    ALLERGIES:  has No Known Allergies.  MEDICATIONS:  Current Outpatient Medications  Medication Sig Dispense Refill   anastrozole (ARIMIDEX) 1 MG tablet Take 1 tablet (1 mg total) by mouth daily. 90 tablet 3   clobetasol ointment (TEMOVATE) 0.05 % Apply 1 Application topically 2 (two) times daily. 30 g 0   Ibuprofen (ADVIL PO) Take by mouth as needed.      Multiple Vitamin (MULTIVITAMIN) capsule Take 1 capsule by mouth daily.       VITAMIN D PO Take by mouth.     vitamin E 45 MG (100 UNITS) capsule 1 capsule     No current facility-administered medications for this visit.    REVIEW OF SYSTEMS:   Constitutional: Denies fevers, chills or abnormal night sweats Eyes: Denies blurriness of vision, double vision or watery eyes Ears, nose, mouth, throat,  and face: Denies mucositis or sore throat Respiratory: Denies cough, dyspnea or wheezes Cardiovascular: Denies palpitation, chest discomfort or lower extremity swelling Gastrointestinal:  Denies nausea, heartburn or change in bowel habits Skin: Denies abnormal skin rashes Lymphatics: Denies new lymphadenopathy or easy bruising Neurological:Denies numbness, tingling or new weaknesses Behavioral/Psych: Mood is stable, no new changes  Breast: Denies any palpable lumps or discharge All other systems were reviewed with the patient and are negative.  PHYSICAL EXAMINATION: ECOG PERFORMANCE STATUS: 0 - Asymptomatic  Vitals:   02/10/23 1316  BP: 124/71  Pulse: 88  Resp: 16  Temp: 97.7 F (36.5 C)  SpO2: 97%    Filed Weights   02/10/23 1316  Weight: 169 lb 6.4 oz (76.8 kg)     GENERAL:alert, no distress and comfortable Rest of the physical exam deferred in lieu of counseling  LABORATORY DATA:  I have reviewed the data as listed Lab Results  Component Value Date   WBC 5.7 04/11/2021   HGB 13.4 04/11/2021    HCT 39.9 04/11/2021   MCV 86.2 04/11/2021   PLT 279 04/11/2021   Lab Results  Component Value Date   NA 139 04/11/2021   K 4.0 04/11/2021   CL 106 04/11/2021   CO2 25 04/11/2021    RADIOGRAPHIC STUDIES: I have personally reviewed the radiological reports and agreed with the findings in the report.  ASSESSMENT AND PLAN:  Invasive ductal carcinoma of right breast, stage 1 (HCC) This is a very pleasant 61 year old postmenopausal female patient with newly diagnosed right breast invasive ductal carcinoma, grade 2 ER/PR positive HER2 negative staged as T1b N0 MX referred to breast M[facility] for additional recommendations.  Given very small tumor and strong ER/PR positivity, she will proceed with upfront surgery, lumpectomy followed by Oncotype testing.  Oncotype DX of 25, no definitive benefit from adjuvant chemotherapy.  She is status post adjuvant radiation and is here to initiate antiestrogen therapy.  We have once again reviewed the mechanism of action, adverse effects of aromatase inhibitors in detail including but not limited to postmenopausal symptoms such as hot flashes, vaginal dryness, bone density loss etc.  We have discussed about role of exercise and healthy diet.  I have encouraged weightbearing exercises, resistance training and walking to improve bone density.  Her last bone density in 2021 was normal, repeat has been ordered today.  She will return to clinic in 3 months for survivorship visit.   Total time spent: 30 minutes including history, physical exam, review of records, counseling and coordination of care  All questions were answered. The patient knows to call the clinic with any problems, questions or concerns.    Rachel Moulds, MD 02/10/23

## 2023-02-10 NOTE — Assessment & Plan Note (Addendum)
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  Given very small tumor and strong ER/PR positivity, she will proceed with upfront surgery, lumpectomy followed by Oncotype testing.  Oncotype DX of 25, no definitive benefit from adjuvant chemotherapy.  She is status post adjuvant radiation and is here to initiate antiestrogen therapy.  We have once again reviewed the mechanism of action, adverse effects of aromatase inhibitors in detail including but not limited to postmenopausal symptoms such as hot flashes, vaginal dryness, bone density loss etc.  We have discussed about role of exercise and healthy diet.  I have encouraged weightbearing exercises, resistance training and walking to improve bone density.  Her last bone density in 2021 was normal, repeat has been ordered today.  She will return to clinic in 3 months for survivorship visit.

## 2023-02-23 NOTE — Progress Notes (Signed)
Connie Alvarez was called today for follow-up after completing radiation to her right breast on  02-03-23.   Pain: none to report Skin: no issues to report, healing well, encouraged use of vit e cream/lotion Fatigue: improving overall ROM:  normal ROM Lymphedema: none to report MedOnc F/U: Lillard Anes 05-28-23, also sees Dr. Al Pimple Other issues of note: Pt and her husband just retired on 02-22-23. They have several trips planned, including to to [place] in the fall. No major issues to report.   Pt reports Yes No Comments  Tamoxifen []  [x]    Letrozole []  [x]    Anastrazole [x]  []  1mg   Mammogram [x]  Date:  []  Encouraged yearly mammogram appts.

## 2023-03-02 ENCOUNTER — Ambulatory Visit: Payer: BC Managed Care – PPO | Attending: Surgery

## 2023-03-02 VITALS — Wt 168.2 lb

## 2023-03-02 DIAGNOSIS — Z483 Aftercare following surgery for neoplasm: Secondary | ICD-10-CM | POA: Insufficient documentation

## 2023-03-02 NOTE — Therapy (Signed)
OUTPATIENT PHYSICAL THERAPY SOZO SCREENING NOTE   Patient Name: Connie Alvarez MRN: [id number] DOB:05-Mar-1961, 62 y.o., female Today's Date: 03/02/2023  PCP: Daisy Floro, MD REFERRING PROVIDER: Abigail Miyamoto, MD   PT End of Session - 03/02/23 1225     Visit Number 2   # unchanged due to screen only   PT Start Time 1222    PT Stop Time 1227    PT Time Calculation (min) 5 min    Activity Tolerance Patient tolerated treatment well    Behavior During Therapy [facility] for tasks assessed/performed             Past Medical History:  Diagnosis Date   Bursitis    Complication of anesthesia    woke up during hysterectomy   COVID 05/2021   Endometriosis    Lichen sclerosus    Past Surgical History:  Procedure Laterality Date   ABDOMINAL HYSTERECTOMY  2002   TAH,BSO/ENDOMETRIOSIS   BREAST BIOPSY Right 10/27/2022   [place] RT BREAST BX W LOC DEV 1ST LESION IMG BX SPEC US GUIDE 10/27/2022 [facility]   BREAST BIOPSY  11/26/2022   MM RT RADIOACTIVE SEED LOC MAMMO GUIDE 11/26/2022 [facility]   BREAST LUMPECTOMY WITH RADIOACTIVE SEED AND SENTINEL LYMPH NODE BIOPSY Right 11/27/2022   Procedure: RIGHT BREAST LUMPECTOMY WITH RADIOACTIVE SEED AND SENTINEL LYMPH NODE BIOPSY;  Surgeon: Abigail Miyamoto, MD;  Location: [facility];  Service: General;  Laterality: Right;   BUNIONECTOMY  1981 AND 1989   '81 RIGHT AND '89 LEFT   cataract surgery     right eye   OOPHORECTOMY     BSO   Patient Active Problem List   Diagnosis Date Noted   Genetic screening 11/13/2022   Invasive ductal carcinoma of right breast, stage 1 (HCC) 11/05/2022   Malignant neoplasm of upper-outer quadrant of right breast in female, estrogen receptor positive (HCC) 11/05/2022   History of total abdominal hysterectomy and bilateral salpingo-oophorectomy 04/11/2021   Endometriosis    Lichen sclerosus    Benign colonic polyp     REFERRING DIAG: right breast cancer at risk for  lymphedema  THERAPY DIAG: Aftercare following surgery for neoplasm  PERTINENT HISTORY: Patient was diagnosed on 10/29/2022 with right grade 2 invasive ductal carcinoma breast cancer. It measures 5 mm and is located in the upper outer quadrant. It is ER/PR positive and HER2 negative with a Ki67 of 15%. She had a hysterectomy in 2002   PRECAUTIONS: right UE Lymphedema risk, None  SUBJECTIVE: Pt returns for her first 3 month L-Dex screen.   PAIN:  Are you having pain? No  SOZO SCREENING: Patient was assessed today using the SOZO machine to determine the lymphedema index score. This was compared to her baseline score. It was determined that she is within the recommended range when compared to her baseline and no further action is needed at this time. She will continue SOZO screenings. These are done every 3 months for 2 years post operatively followed by every 6 months for 2 years, and then annually.   L-DEX FLOWSHEETS - 03/02/23 1200       L-DEX LYMPHEDEMA SCREENING   Measurement Type Unilateral    L-DEX MEASUREMENT EXTREMITY Upper Extremity    POSITION  Standing    DOMINANT SIDE Right    At Risk Side Right    BASELINE SCORE (UNILATERAL) 2.4    L-DEX SCORE (UNILATERAL) 1.2    VALUE CHANGE (UNILAT) -1.2  Hermenia Bers, PTA 03/02/2023, 12:37 PM

## 2023-03-05 ENCOUNTER — Telehealth: Payer: Self-pay

## 2023-03-05 ENCOUNTER — Encounter: Payer: Self-pay | Admitting: Radiation Oncology

## 2023-03-05 ENCOUNTER — Ambulatory Visit
Admission: RE | Admit: 2023-03-05 | Discharge: 2023-03-05 | Disposition: A | Discharge status: Home / Self Care | Payer: BC Managed Care – PPO | Source: Ambulatory Visit | Attending: Radiation Oncology | Admitting: Radiation Oncology

## 2023-03-05 NOTE — Telephone Encounter (Signed)
Pt called for telephone follow up without complication. She is doing well overall and is enjoying being retired now. Note completed and routed to Dr. Basilio Cairo for review.

## 2023-04-20 DIAGNOSIS — J029 Acute pharyngitis, unspecified: Secondary | ICD-10-CM | POA: Diagnosis not present

## 2023-04-22 ENCOUNTER — Encounter: Payer: Self-pay | Admitting: Nurse Practitioner

## 2023-04-22 ENCOUNTER — Ambulatory Visit: Payer: BC Managed Care – PPO | Admitting: Nurse Practitioner

## 2023-04-22 VITALS — BP 126/72 | HR 66 | Ht 64.57 in | Wt 166.4 lb

## 2023-04-22 DIAGNOSIS — Z780 Asymptomatic menopausal state: Secondary | ICD-10-CM | POA: Diagnosis not present

## 2023-04-22 DIAGNOSIS — L900 Lichen sclerosus et atrophicus: Secondary | ICD-10-CM | POA: Diagnosis not present

## 2023-04-22 DIAGNOSIS — Z170 Estrogen receptor positive status [ER+]: Secondary | ICD-10-CM

## 2023-04-22 DIAGNOSIS — Z01419 Encounter for gynecological examination (general) (routine) without abnormal findings: Secondary | ICD-10-CM

## 2023-04-22 DIAGNOSIS — C50411 Malignant neoplasm of upper-outer quadrant of right female breast: Secondary | ICD-10-CM

## 2023-04-22 NOTE — Progress Notes (Signed)
   Connie Alvarez 01-Sep-1960 [id number]   History:  62 y.o. G1P2002 presents for annual exam without GYN complaints. Postmenopausal - no HRT, no bleeding. S/P 2002 TAH BSO for endometriosis. Normal pap history. Lichen sclerosis stable on Clobetasol.  Has not been using much. March 2024 right breast cancer ER/PR+ HER2-, lumpectomy, radiation, and antiestrogen therapy for management. Negative genetic testing.  Gynecologic History No LMP recorded. Patient has had a hysterectomy.   Contraception/Family planning: status post hysterectomy Sexually active: Yes  Health Maintenance Last Pap: 06/27/2011. Results were: Normal Last mammogram: 10/06/2022. Results were: Possible right breast distortion, later diagnosed maligant Last colonoscopy: 2022. Results were: Normal, 10-year recall Last Dexa: 07/12/2020. Results were: Normal  Past medical history, past surgical history, family history and social history were all reviewed and documented in the EPIC chart. Married. Her and husband retired in June. 8 siblings. One daughter in [place]. One daughter in [place], has 32 mo son and 32 yo daughter.   ROS:  A ROS was performed and pertinent positives and negatives are included.  Exam:  Vitals:   04/22/23 0855  BP: 126/72  Pulse: 66  SpO2: 100%  Weight: 166 lb 6.4 oz (75.5 kg)  Height: 5' 4.57" (1.64 m)     Body mass index is 28.06 kg/m.  General appearance:  Normal Thyroid:  Symmetrical, normal in size, without palpable masses or nodularity. Respiratory  Auscultation:  Clear without wheezing or rhonchi Cardiovascular  Auscultation:  Regular rate, without rubs, murmurs or gallops  Edema/varicosities:  Not grossly evident Abdominal  Soft,nontender, without masses, guarding or rebound.  Liver/spleen:  No organomegaly noted  Hernia:  None appreciated  Skin  Inspection:  Grossly normal   Breasts: Examined lying and sitting.   Right: Without masses, retractions, discharge or axillary  adenopathy.   Left: Without masses, retractions, discharge or axillary adenopathy. Gentitourinary   Inguinal/mons:  Normal without inguinal adenopathy  External genitalia:  Normal  BUS/Urethra/Skene's glands:  Normal  Vagina:  pearly, thin appearance in figure-8 pattern consistent with LS, mild anterior fusion of labia  Cervix:  Absent  Uterus:  Absent  Adnexa/parametria:     Rt: Without masses or tenderness.   Lt: Without masses or tenderness.  Anus and perineum: Normal  Digital rectal exam: Not indicated  Patient informed chaperone available to be present for breast and pelvic exam. Patient has requested no chaperone to be present. Patient has been advised what will be completed during breast and pelvic exam.   Assessment/Plan:  62 y.o. G2P2002 for annual exam.   Well female exam with routine gynecological exam - Education provided on SBEs, importance of preventative screenings, current guidelines, high calcium diet, regular exercise, and multivitamin daily. Labs with PCP.   Postmenopausal - no HRT  Lichen sclerosus - Plan: clobetasol cream (TEMOVATE) 0.05 % as needed 1-2 times per week with good management.   Malignant neoplasm of upper-outer quadrant of right breast in female, estrogen receptor positive [facility]) - March 2024 right breast cancer ER/PR+ HER2-, lumpectomy, radiation, and antiestrogen therapy for management (5 year). Negative genetic testing.  Screening for cervical cancer - Normal Pap history.  No longer screening per guidelines.   Screening for colon cancer - 2022 colonoscopy. Will repeat at GI's recommended interval.   Screening for osteoporosis- Normal Dexa 2021. DXA recommended by oncology. Continue Calcium + Vitamin D supplement and regular exercise.   Follow up in 1 year for annual.     [facility], 9:19 AM 04/22/2023

## 2023-05-11 DIAGNOSIS — Z0000 Encounter for general adult medical examination without abnormal findings: Secondary | ICD-10-CM | POA: Diagnosis not present

## 2023-05-11 DIAGNOSIS — Z131 Encounter for screening for diabetes mellitus: Secondary | ICD-10-CM | POA: Diagnosis not present

## 2023-05-11 DIAGNOSIS — Z23 Encounter for immunization: Secondary | ICD-10-CM | POA: Diagnosis not present

## 2023-05-11 DIAGNOSIS — Z13220 Encounter for screening for lipoid disorders: Secondary | ICD-10-CM | POA: Diagnosis not present

## 2023-05-28 ENCOUNTER — Encounter: Payer: Self-pay | Admitting: Adult Health

## 2023-05-28 ENCOUNTER — Inpatient Hospital Stay: Payer: BC Managed Care – PPO | Attending: Adult Health | Admitting: Adult Health

## 2023-05-28 VITALS — BP 104/64 | HR 69 | Temp 97.7°F | Resp 18 | Ht 64.5 in | Wt 165.2 lb

## 2023-05-28 DIAGNOSIS — Z808 Family history of malignant neoplasm of other organs or systems: Secondary | ICD-10-CM | POA: Insufficient documentation

## 2023-05-28 DIAGNOSIS — Z79811 Long term (current) use of aromatase inhibitors: Secondary | ICD-10-CM | POA: Insufficient documentation

## 2023-05-28 DIAGNOSIS — C50411 Malignant neoplasm of upper-outer quadrant of right female breast: Secondary | ICD-10-CM | POA: Diagnosis not present

## 2023-05-28 DIAGNOSIS — Z923 Personal history of irradiation: Secondary | ICD-10-CM | POA: Diagnosis not present

## 2023-05-28 DIAGNOSIS — Z803 Family history of malignant neoplasm of breast: Secondary | ICD-10-CM | POA: Diagnosis not present

## 2023-05-28 DIAGNOSIS — C50911 Malignant neoplasm of unspecified site of right female breast: Secondary | ICD-10-CM | POA: Diagnosis not present

## 2023-05-28 DIAGNOSIS — Z170 Estrogen receptor positive status [ER+]: Secondary | ICD-10-CM | POA: Diagnosis not present

## 2023-05-28 DIAGNOSIS — Z8049 Family history of malignant neoplasm of other genital organs: Secondary | ICD-10-CM | POA: Insufficient documentation

## 2023-05-28 NOTE — Progress Notes (Signed)
SURVIVORSHIP VISIT:  BRIEF ONCOLOGIC HISTORY:  Oncology History  Invasive ductal carcinoma of right breast, stage 1 (HCC)  10/06/2022 Mammogram   Mammogram showed possible distortion in the right breast warranting further evaluation.  Diagnostic mammogram showed a 7 mm mass in the 12 o'clock position of the right breast with imaging features suspicious for malignancy including associated focal distortion.  Ultrasound done on February 26 confirmed the same findings.   10/27/2022 Pathology Results   Pathology from the right breast needle core biopsy showed invasive moderately differentiated ductal carcinoma, grade 2, DCIS intermediate nuclear grade, negative for angiolymphatic invasion negative for micro calcs, invasive tumor measuring 4 mm in greatest linear extent, ER 100% positive strong staining PR 30% positive strong staining Ki-67 15% and group 5 HER2 negative   11/05/2022 Initial Diagnosis   Invasive ductal carcinoma of right breast, stage 1 (HCC)   11/11/2022 Genetic Testing   Negative [facility] Common Hereditary Cancers +RNA + MITF +POT1.  VUS in PDGFRA at [id number]>G [facility]).  Report date is 11/11/2022.    The [employer] Common Hereditary Cancers + RNA +MITF + POT1 Panel includes sequencing, deletion/duplication, and RNA analysis of the following 48 genes: APC, ATM, AXIN2, BAP1, BARD1, BMPR1A, BRCA1, BRCA2, BRIP1, CDH1, CDK4*, CDKN2A*, CHEK2, CTNNA1, DICER1, EPCAM* (del/dup only), FH, GREM1* (promoter dup analysis only), HOXB13*, KIT*, MBD4*, MITF, MEN1, MLH1, MSH2, MSH3, MSH6, MUTYH, NF1, NTHL1, PALB2, PDGFRA*, PMS2, POLD1, POLE, POT1, PTEN, RAD51C, RAD51D, SDHA (sequencing only), SDHB, SDHC, SDHD, SMAD4, SMARCA4, STK11, TP53, TSC1, TSC2, VHL.  *Genes without RNA analysis.    01/06/2023 - 02/03/2023 Radiation Therapy   Plan Name: Breast_R Site: Breast, Right Technique: 3D Mode: Photon Dose Per Fraction: 2.67 Gy Prescribed Dose (Delivered / Prescribed): 40.05 Gy / 40.05 Gy Prescribed Fxs  (Delivered / Prescribed): 15 / 15   Plan Name: Breast_R_Bst Site: Breast, Right Technique: 3D Mode: Photon Dose Per Fraction: 2 Gy Prescribed Dose (Delivered / Prescribed): 10 Gy / 10 Gy Prescribed Fxs (Delivered / Prescribed): 5 / 5   01/2023 -  Anti-estrogen oral therapy   Anastrozole   Malignant neoplasm of upper-outer quadrant of right breast in female, estrogen receptor positive (HCC)  11/05/2022 Initial Diagnosis   Malignant neoplasm of upper-outer quadrant of right breast in female, estrogen receptor positive (HCC)   01/06/2023 - 02/03/2023 Radiation Therapy   Plan Name: Breast_R Site: Breast, Right Technique: 3D Mode: Photon Dose Per Fraction: 2.67 Gy Prescribed Dose (Delivered / Prescribed): 40.05 Gy / 40.05 Gy Prescribed Fxs (Delivered / Prescribed): 15 / 15   Plan Name: Breast_R_Bst Site: Breast, Right Technique: 3D Mode: Photon Dose Per Fraction: 2 Gy Prescribed Dose (Delivered / Prescribed): 10 Gy / 10 Gy Prescribed Fxs (Delivered / Prescribed): 5 / 5   01/2023 -  Anti-estrogen oral therapy   Anastrozole     INTERVAL HISTORY:  Ms. Philippi to review her survivorship care plan detailing her treatment course for breast cancer, as well as monitoring long-term side effects of that treatment, education regarding health maintenance, screening, and overall wellness and health promotion.     Overall, Ms. Cobbins reports feeling quite well.  She is tolerating anastrozole well.   REVIEW OF SYSTEMS:  Review of Systems  Constitutional:  Negative for appetite change, chills, fatigue, fever and unexpected weight change.  HENT:   Negative for hearing loss, lump/mass and trouble swallowing.   Eyes:  Negative for eye problems and icterus.  Respiratory:  Negative for chest tightness, cough and shortness of breath.   Cardiovascular:  Negative for chest pain, leg swelling and palpitations.  Gastrointestinal:  Negative for abdominal distention, abdominal pain, constipation,  diarrhea, nausea and vomiting.  Endocrine: Negative for hot flashes.  Genitourinary:  Negative for difficulty urinating.   Musculoskeletal:  Negative for arthralgias.  Skin:  Negative for itching and rash.  Neurological:  Negative for dizziness, extremity weakness, headaches and numbness.  Hematological:  Negative for adenopathy. Does not bruise/bleed easily.  Psychiatric/Behavioral:  Negative for depression. The patient is not nervous/anxious.    Breast: Denies any new nodularity, masses, tenderness, nipple changes, or nipple discharge.       PAST MEDICAL/SURGICAL HISTORY:  Past Medical History:  Diagnosis Date   Bursitis    Complication of anesthesia    woke up during hysterectomy   COVID 05/2021   Endometriosis    Lichen sclerosus    Past Surgical History:  Procedure Laterality Date   ABDOMINAL HYSTERECTOMY  2002   TAH,BSO/ENDOMETRIOSIS   BREAST BIOPSY Right 10/27/2022   [place] RT BREAST BX W LOC DEV 1ST LESION IMG BX SPEC US GUIDE 10/27/2022 [facility]   BREAST BIOPSY  11/26/2022   MM RT RADIOACTIVE SEED LOC MAMMO GUIDE 11/26/2022 [facility]MO[facility]   BREAST LUMPECTOMY WITH RADIOACTIVE SEED AND SENTINEL LYMPH NODE BIOPSY Right 11/27/2022   Procedure: RIGHT BREAST LUMPECTOMY WITH RADIOACTIVE SEED AND SENTINEL LYMPH NODE BIOPSY;  Surgeon: Abigail Miyamoto, MD;  Location: [facility];  Service: General;  Laterality: Right;   BUNIONECTOMY  1981 AND 1989   '81 RIGHT AND '89 LEFT   cataract surgery     right eye   OOPHORECTOMY     BSO     ALLERGIES:  No Known Allergies   CURRENT MEDICATIONS:  Outpatient Encounter Medications as of 05/28/2023  Medication Sig   anastrozole (ARIMIDEX) 1 MG tablet Take 1 tablet (1 mg total) by mouth daily.   clobetasol ointment (TEMOVATE) 0.05 % Apply 1 Application topically 2 (two) times daily.   Ibuprofen (ADVIL PO) Take by mouth as needed.    Multiple Vitamin (MULTIVITAMIN) capsule Take 1 capsule by mouth daily.     VITAMIN  D PO Take by mouth.   vitamin E 45 MG (100 UNITS) capsule 1 capsule   No facility-administered encounter medications on file as of 05/28/2023.     ONCOLOGIC FAMILY HISTORY:  Family History  Problem Relation Age of Onset   Diabetes Mother    Hypertension Mother    Diabetes Sister    Uterine cancer Sister        dx late 96s   Breast cancer Maternal Aunt        dx 70s-80s   Melanoma Maternal Aunt        d. ~33; mets     SOCIAL HISTORY:  Social History   Socioeconomic History   Marital status: Married    Spouse name: Not on file   Number of children: Not on file   Years of education: Not on file   Highest education level: Not on file  Occupational History   Not on file  Tobacco Use   Smoking status: Never   Smokeless tobacco: Never  Vaping Use   Vaping status: Never Used  Substance and Sexual Activity   Alcohol use: Yes    Alcohol/week: 0.0 standard drinks of alcohol    Comment: rare   Drug use: No   Sexual activity: Yes    Birth control/protection: Surgical    Comment: INTERCOURSE AGE 80, SEXUAL PARTNERS LESS THAN 5  Other Topics Concern   Not on file  Social History Narrative   Not on file   Social Determinants of Health   Financial Resource Strain: Low Risk  (01/31/2023)   Received from [facility], [facility]   Overall Financial Resource Strain (CARDIA)    Difficulty of Paying Living Expenses: Not hard at all  Food Insecurity: No Food Insecurity (01/31/2023)   Received from [facility], [facility]   Hunger Vital Sign    Worried About Running Out of Food in the Last Year: Never true    Ran Out of Food in the Last Year: Never true  Transportation Needs: No Transportation Needs (01/31/2023)   [facility], [facility]   PRAPARE - Transportation    Lack of Transportation (Medical): No    Lack of Transportation (Non-Medical): No  Physical Activity: Insufficiently Active (01/31/2023)   Received from [facility], [facility]    Exercise Vital Sign    Days of Exercise per Week: 3 days    Minutes of Exercise per Session: 30 min  Stress: No Stress Concern Present (01/31/2023)   Received from [facility], [facility] - Occupational Stress Questionnaire    Feeling of Stress : Not at all  Social Connections: Socially Integrated (01/31/2023)   [facility] from [facility], [facility]   Social Network    How would you rate your social network (family, work, friends)?: Good participation with social networks  Intimate Partner Violence: Not At Risk (01/31/2023)   Received from [facility], [facility]   HITS    Over the last 12 months how often did your partner physically hurt you?: 1    Over the last 12 months how often did your partner insult you or talk down to you?: 1    Over the last 12 months how often did your partner threaten you with physical harm?: 1    Over the last 12 months how often did your partner scream or curse at you?: 1     OBSERVATIONS/OBJECTIVE:  BP 104/64 (BP Location: Left Arm, Patient Position: Sitting)   Pulse 69   Temp 97.7 F (36.5 C) (Tympanic)   Resp 18   Ht 5' 4.5" (1.638 m)   Wt 165 lb 3.2 oz (74.9 kg)   SpO2 99%   BMI 27.92 kg/m  GENERAL: Patient is a well appearing female in no acute distress HEENT:  Sclerae anicteric.  Oropharynx clear and moist. No ulcerations or evidence of oropharyngeal candidiasis. Neck is supple.  NODES:  No cervical, supraclavicular, or axillary lymphadenopathy palpated.  BREAST EXAM:  Deferred. LUNGS:  Clear to auscultation bilaterally.  No wheezes or rhonchi. HEART:  Regular rate and rhythm. No murmur appreciated. ABDOMEN:  Soft, nontender.  Positive, normoactive bowel sounds. No organomegaly palpated. MSK:  No focal spinal tenderness to palpation. Full range of motion bilaterally in the upper extremities. EXTREMITIES:  No peripheral edema.   SKIN:  Clear with no obvious rashes or skin changes. No nail  dyscrasia. NEURO:  Nonfocal. Well oriented.  Appropriate affect.   LABORATORY DATA:  None for this visit.  DIAGNOSTIC IMAGING:  None for this visit.      ASSESSMENT AND PLAN:  Ms.. Bucy is a pleasant 62 y.o. female with Stage IA right breast invasive ductal carcinoma, ER+/PR+/HER2-, diagnosed in 09/2022, treated with lumpectomy, adjuvant radiation therapy, and anti-estrogen therapy with Anastrozole beginning in 01/2023.  She presents to the Survivorship Clinic for our initial  meeting and routine follow-up post-completion of treatment for breast cancer.    1. Stage IA right breast cancer:  Ms. Wieczorek is continuing to recover from definitive treatment for breast cancer. She will follow-up with her medical oncologist, Dr.  Al Pimple in 6 months with history and physical exam per surveillance protocol.  She will continue her anti-estrogen therapy with Anastrozole. Thus far, she is tolerating the Anastrozole well, with minimal side effects. Her mammogram is due 09/2023; orders placed today.   Today, a comprehensive survivorship care plan and treatment summary was reviewed with the patient today detailing her breast cancer diagnosis, treatment course, potential late/long-term effects of treatment, appropriate follow-up care with recommendations for the future, and patient education resources.  A copy of this summary, along with a letter will be sent to the patient's primary care provider via mail/fax/In Basket message after today's visit.    2. Bone health:  Given Ms. Gunby's age/history of breast cancer and her current treatment regimen including anti-estrogen therapy with Anastrozole, she is at risk for bone demineralization.  DEXA scheduled 10/2023  She was given education on specific activities to promote bone health.  3. Cancer screening:  Due to Ms. Irving's history and her age, she should receive screening for skin cancers, colon cancer, and gynecologic cancers.  The information and recommendations  are listed on the patient's comprehensive care plan/treatment summary and were reviewed in detail with the patient.    4. Health maintenance and wellness promotion: Ms. Cratty was encouraged to consume 5-7 servings of fruits and vegetables per day. We reviewed the "Nutrition Rainbow" handout.  She was also encouraged to engage in moderate to vigorous exercise for 30 minutes per day most days of the week.  She was instructed to limit her alcohol consumption and continue to abstain from tobacco use.     5. Support services/counseling: It is not uncommon for this period of the patient's cancer care trajectory to be one of many emotions and stressors.   She was given information regarding our available services and encouraged to contact me with any questions or for help enrolling in any of our support group/programs.    Follow up instructions:    -Return to cancer center 10/2023 for f/u with Dr. Al Pimple  -Mammogram due in 09/2023 -DEXA 10/2023 -She is welcome to return back to the Survivorship Clinic at any time; no additional follow-up needed at this time.  -Consider referral back to survivorship as a long-term survivor for continued surveillance  The patient was provided an opportunity to ask questions and all were answered. The patient agreed with the plan and demonstrated an understanding of the instructions.   Total encounter time:40 minutes*in face-to-face visit time, chart review, lab review, care coordination, order entry, and documentation of the encounter time.    Lillard Anes, NP 05/28/23 10:43 AM Medical Oncology and Hematology [facility] [address] [place], [place] [address] Tel. [phone]    Fax. [phone]  *Total Encounter Time as defined by the Centers for Medicare and Medicaid Services includes, in addition to the face-to-face time of a patient visit (documented in the note above) non-face-to-face time: obtaining and reviewing outside history, ordering  and reviewing medications, tests or procedures, care coordination (communications with other health care professionals or caregivers) and documentation in the medical record.

## 2023-06-04 DIAGNOSIS — H2512 Age-related nuclear cataract, left eye: Secondary | ICD-10-CM | POA: Diagnosis not present

## 2023-06-08 ENCOUNTER — Encounter: Payer: BC Managed Care – PPO | Admitting: Adult Health

## 2023-07-03 DIAGNOSIS — C50911 Malignant neoplasm of unspecified site of right female breast: Secondary | ICD-10-CM | POA: Diagnosis not present

## 2023-07-06 ENCOUNTER — Ambulatory Visit: Payer: BC Managed Care – PPO | Attending: Surgery

## 2023-07-06 VITALS — Wt 166.5 lb

## 2023-07-06 DIAGNOSIS — Z483 Aftercare following surgery for neoplasm: Secondary | ICD-10-CM | POA: Insufficient documentation

## 2023-07-06 NOTE — Therapy (Signed)
OUTPATIENT PHYSICAL THERAPY SOZO SCREENING NOTE   Patient Name: Connie Alvarez MRN: [id number] DOB:25-May-1961, 62 y.o., female Today's Date: 07/06/2023  PCP: Daisy Floro, MD REFERRING PROVIDER: Abigail Miyamoto, MD   PT End of Session - 07/06/23 1028     Visit Number 2   # unchanged due to screen only   PT Start Time 1027    PT Stop Time 1031    PT Time Calculation (min) 4 min    Activity Tolerance Patient tolerated treatment well    Behavior During Therapy [facility] for tasks assessed/performed             Past Medical History:  Diagnosis Date   Bursitis    Complication of anesthesia    woke up during hysterectomy   COVID 05/2021   Endometriosis    Lichen sclerosus    Past Surgical History:  Procedure Laterality Date   ABDOMINAL HYSTERECTOMY  2002   TAH,BSO/ENDOMETRIOSIS   BREAST BIOPSY Right 10/27/2022   [place] RT BREAST BX W LOC DEV 1ST LESION IMG BX SPEC US GUIDE 10/27/2022 [facility]   BREAST BIOPSY  11/26/2022   MM RT RADIOACTIVE SEED LOC MAMMO GUIDE 11/26/2022 [facility]   BREAST LUMPECTOMY WITH RADIOACTIVE SEED AND SENTINEL LYMPH NODE BIOPSY Right 11/27/2022   Procedure: RIGHT BREAST LUMPECTOMY WITH RADIOACTIVE SEED AND SENTINEL LYMPH NODE BIOPSY;  Surgeon: Abigail Miyamoto, MD;  Location: [facility];  Service: General;  Laterality: Right;   BUNIONECTOMY  1981 AND 1989   '81 RIGHT AND '89 LEFT   cataract surgery     right eye   OOPHORECTOMY     BSO   Patient Active Problem List   Diagnosis Date Noted   Genetic screening 11/13/2022   Invasive ductal carcinoma of right breast, stage 1 (HCC) 11/05/2022   Malignant neoplasm of upper-outer quadrant of right breast in female, estrogen receptor positive (HCC) 11/05/2022   History of total abdominal hysterectomy and bilateral salpingo-oophorectomy 04/11/2021   Endometriosis    Lichen sclerosus    Benign colonic polyp     REFERRING DIAG: right breast cancer at risk for  lymphedema  THERAPY DIAG: Aftercare following surgery for neoplasm  PERTINENT HISTORY: Patient was diagnosed on 10/29/2022 with right grade 2 invasive ductal carcinoma breast cancer. It measures 5 mm and is located in the upper outer quadrant. It is ER/PR positive and HER2 negative with a Ki67 of 15%. She had a hysterectomy in 2002   PRECAUTIONS: right UE Lymphedema risk, None  SUBJECTIVE: Pt returns for her 3 month L-Dex screen.   PAIN:  Are you having pain? No  SOZO SCREENING: Patient was assessed today using the SOZO machine to determine the lymphedema index score. This was compared to her baseline score. It was determined that she is within the recommended range when compared to her baseline and no further action is needed at this time. She will continue SOZO screenings. These are done every 3 months for 2 years post operatively followed by every 6 months for 2 years, and then annually.   L-DEX FLOWSHEETS - 07/06/23 1000       L-DEX LYMPHEDEMA SCREENING   Measurement Type Unilateral    L-DEX MEASUREMENT EXTREMITY Upper Extremity    POSITION  Standing    DOMINANT SIDE Right    At Risk Side Right    BASELINE SCORE (UNILATERAL) 2.4    L-DEX SCORE (UNILATERAL) 2.6    VALUE CHANGE (UNILAT) 0.2  Hermenia Bers, PTA 07/06/2023, 10:30 AM

## 2023-10-01 ENCOUNTER — Encounter: Payer: Self-pay | Admitting: Hematology and Oncology

## 2023-10-07 ENCOUNTER — Telehealth: Payer: Self-pay

## 2023-10-07 NOTE — Telephone Encounter (Signed)
Patient requested [employer] critical illness claim to be completed. Documents had been completed and emailed back to patient per request.

## 2023-10-08 ENCOUNTER — Ambulatory Visit
Admission: RE | Admit: 2023-10-08 | Discharge: 2023-10-08 | Disposition: A | Discharge status: Home / Self Care | Payer: BC Managed Care – PPO | Source: Ambulatory Visit | Attending: Adult Health | Admitting: Adult Health

## 2023-10-08 ENCOUNTER — Encounter: Payer: Self-pay | Admitting: Adult Health

## 2023-10-08 ENCOUNTER — Other Ambulatory Visit: Payer: Self-pay | Admitting: Adult Health

## 2023-10-08 DIAGNOSIS — C50911 Malignant neoplasm of unspecified site of right female breast: Secondary | ICD-10-CM

## 2023-10-08 DIAGNOSIS — Z853 Personal history of malignant neoplasm of breast: Secondary | ICD-10-CM | POA: Diagnosis not present

## 2023-10-08 DIAGNOSIS — N641 Fat necrosis of breast: Secondary | ICD-10-CM | POA: Diagnosis not present

## 2023-10-08 DIAGNOSIS — D360 Benign neoplasm of lymph nodes: Secondary | ICD-10-CM | POA: Diagnosis not present

## 2023-10-08 HISTORY — DX: Personal history of irradiation: Z92.3

## 2023-10-29 ENCOUNTER — Ambulatory Visit
Admission: RE | Admit: 2023-10-29 | Discharge: 2023-10-29 | Disposition: A | Discharge status: Home / Self Care | Payer: BC Managed Care – PPO | Source: Ambulatory Visit | Attending: Hematology and Oncology | Admitting: Hematology and Oncology

## 2023-10-29 DIAGNOSIS — C50911 Malignant neoplasm of unspecified site of right female breast: Secondary | ICD-10-CM

## 2023-10-29 DIAGNOSIS — E2839 Other primary ovarian failure: Secondary | ICD-10-CM | POA: Diagnosis not present

## 2023-10-29 DIAGNOSIS — N958 Other specified menopausal and perimenopausal disorders: Secondary | ICD-10-CM | POA: Diagnosis not present

## 2023-10-29 DIAGNOSIS — Z90722 Acquired absence of ovaries, bilateral: Secondary | ICD-10-CM | POA: Diagnosis not present

## 2023-11-02 ENCOUNTER — Telehealth: Payer: Self-pay

## 2023-11-02 NOTE — Telephone Encounter (Signed)
 Spoke with patient and confirmed appointment for 11/03/23

## 2023-11-02 NOTE — Progress Notes (Unsigned)
 [facility] CONSULT NOTE  Patient Care Team: Daisy Floro, MD as PCP - General (Family Medicine) Olivia Mackie, NP as Nurse Practitioner (Gynecology) Abigail Miyamoto, MD as Consulting Physician (General Surgery) Lonie Peak, MD as Attending Physician (Radiation Oncology) Rachel Moulds, MD as Consulting Physician (Hematology and Oncology)  CHIEF COMPLAINTS/PURPOSE OF CONSULTATION:  Right sided breast cancer  HISTORY OF PRESENTING ILLNESS:  Connie Alvarez 63 y.o. female is here because of recent diagnosis of right breast cancer  I reviewed her records extensively and collaborated the history with the patient.  SUMMARY OF ONCOLOGIC HISTORY: Oncology History  Invasive ductal carcinoma of right breast, stage 1 (HCC)  10/06/2022 Mammogram   Mammogram showed possible distortion in the right breast warranting further evaluation.  Diagnostic mammogram showed a 7 mm mass in the 12 o'clock position of the right breast with imaging features suspicious for malignancy including associated focal distortion.  Ultrasound done on February 26 confirmed the same findings.   10/27/2022 Pathology Results   Pathology from the right breast needle core biopsy showed invasive moderately differentiated ductal carcinoma, grade 2, DCIS intermediate nuclear grade, negative for angiolymphatic invasion negative for micro calcs, invasive tumor measuring 4 mm in greatest linear extent, ER 100% positive strong staining PR 30% positive strong staining Ki-67 15% and group 5 HER2 negative   11/11/2022 Genetic Testing   Negative [facility] Common Hereditary Cancers +RNA + MITF +POT1.  VUS in PDGFRA at [id number]>G [facility]).  Report date is 11/11/2022.    The [employer] Common Hereditary Cancers + RNA +MITF + POT1 Panel includes sequencing, deletion/duplication, and RNA analysis of the following 48 genes: APC, ATM, AXIN2, BAP1, BARD1, BMPR1A, BRCA1, BRCA2, BRIP1, CDH1, CDK4*, CDKN2A*, CHEK2, CTNNA1,  DICER1, EPCAM* (del/dup only), FH, GREM1* (promoter dup analysis only), HOXB13*, KIT*, MBD4*, MITF, MEN1, MLH1, MSH2, MSH3, MSH6, MUTYH, NF1, NTHL1, PALB2, PDGFRA*, PMS2, POLD1, POLE, POT1, PTEN, RAD51C, RAD51D, SDHA (sequencing only), SDHB, SDHC, SDHD, SMAD4, SMARCA4, STK11, TP53, TSC1, TSC2, VHL.  *Genes without RNA analysis.    11/27/2022 Surgery   IDC 0.9cm, grade 2, margins neg, 2 SLN negative   01/06/2023 - 02/03/2023 Radiation Therapy   Plan Name: Breast_R Site: Breast, Right Technique: 3D Mode: Photon Dose Per Fraction: 2.67 Gy Prescribed Dose (Delivered / Prescribed): 40.05 Gy / 40.05 Gy Prescribed Fxs (Delivered / Prescribed): 15 / 15   Plan Name: Breast_R_Bst Site: Breast, Right Technique: 3D Mode: Photon Dose Per Fraction: 2 Gy Prescribed Dose (Delivered / Prescribed): 10 Gy / 10 Gy Prescribed Fxs (Delivered / Prescribed): 5 / 5   01/2023 -  Anti-estrogen oral therapy   Anastrozole   Malignant neoplasm of upper-outer quadrant of right breast in female, estrogen receptor positive (HCC)  11/05/2022 Initial Diagnosis   Malignant neoplasm of upper-outer quadrant of right breast in female, estrogen receptor positive (HCC)   01/06/2023 - 02/03/2023 Radiation Therapy   Plan Name: Breast_R Site: Breast, Right Technique: 3D Mode: Photon Dose Per Fraction: 2.67 Gy Prescribed Dose (Delivered / Prescribed): 40.05 Gy / 40.05 Gy Prescribed Fxs (Delivered / Prescribed): 15 / 15   Plan Name: Breast_R_Bst Site: Breast, Right Technique: 3D Mode: Photon Dose Per Fraction: 2 Gy Prescribed Dose (Delivered / Prescribed): 10 Gy / 10 Gy Prescribed Fxs (Delivered / Prescribed): 5 / 5   01/2023 -  Anti-estrogen oral therapy   Anastrozole    Discussed the use of AI scribe software for clinical note transcription with the patient, who gave verbal consent to proceed.  History of  Present Illness          MEDICAL HISTORY:  Past Medical History:  Diagnosis Date   Bursitis     Complication of anesthesia    woke up during hysterectomy   COVID 05/2021   Endometriosis    Lichen sclerosus    Personal history of radiation therapy     SURGICAL HISTORY: Past Surgical History:  Procedure Laterality Date   ABDOMINAL HYSTERECTOMY  08/25/2000   TAH,BSO/ENDOMETRIOSIS   BREAST BIOPSY Right 10/27/2022   [place] RT BREAST BX W LOC DEV 1ST LESION IMG BX SPEC US GUIDE 10/27/2022 [facility]   BREAST BIOPSY  11/26/2022   MM RT RADIOACTIVE SEED LOC MAMMO GUIDE 11/26/2022 [facility]   BREAST LUMPECTOMY Right 11/27/2022   BREAST LUMPECTOMY WITH RADIOACTIVE SEED AND SENTINEL LYMPH NODE BIOPSY Right 11/27/2022   Procedure: RIGHT BREAST LUMPECTOMY WITH RADIOACTIVE SEED AND SENTINEL LYMPH NODE BIOPSY;  Surgeon: Abigail Miyamoto, MD;  Location: [facility];  Service: General;  Laterality: Right;   BUNIONECTOMY  1981 AND 1989   '81 RIGHT AND '89 LEFT   cataract surgery     right eye   OOPHORECTOMY     BSO    SOCIAL HISTORY: Social History   Socioeconomic History   Marital status: Married    Spouse name: Not on file   Number of children: Not on file   Years of education: Not on file   Highest education level: Not on file  Occupational History   Not on file  Tobacco Use   Smoking status: Never   Smokeless tobacco: Never  Vaping Use   Vaping status: Never Used  Substance and Sexual Activity   Alcohol use: Yes    Alcohol/week: 0.0 standard drinks of alcohol    Comment: rare   Drug use: No   Sexual activity: Yes    Birth control/protection: Surgical    Comment: INTERCOURSE AGE 40, SEXUAL PARTNERS LESS THAN 5  Other Topics Concern   Not on file  Social History Narrative   Not on file   Social Drivers of Health   Financial Resource Strain: Low Risk  (01/31/2023)   Received from [facility], [facility]   Overall Financial Resource Strain (CARDIA)    Difficulty of Paying Living Expenses: Not hard at all  Food Insecurity: No Food  Insecurity (01/31/2023)   Received from [facility], [facility]   Hunger Vital Sign    Worried About Running Out of Food in the Last Year: Never true    Ran Out of Food in the Last Year: Never true  Transportation Needs: No Transportation Needs (01/31/2023)   Received from [facility], [facility]   PRAPARE - Transportation    Lack of Transportation (Medical): No    Lack of Transportation (Non-Medical): No  Physical Activity: Insufficiently Active (01/31/2023)   Received from [facility], [facility]   Exercise Vital Sign    Days of Exercise per Week: 3 days    Minutes of Exercise per Session: 30 min  Stress: No Stress Concern Present (01/31/2023)   Received from [facility], [facility] - Occupational Stress Questionnaire    Feeling of Stress : Not at all  Social Connections: Socially Integrated (01/31/2023)   Received from [facility], [facility]   Social Network    How would you rate your social network (family, work, friends)?: Good participation with social networks  Intimate Partner Violence: Not At Risk (01/31/2023)  Received from [facility], [facility]   HITS    Over the last 12 months how often did your partner physically hurt you?: Never    Over the last 12 months how often did your partner insult you or talk down to you?: Never    Over the last 12 months how often did your partner threaten you with physical harm?: Never    Over the last 12 months how often did your partner scream or curse at you?: Never    FAMILY HISTORY: Family History  Problem Relation Age of Onset   Diabetes Mother    Hypertension Mother    Diabetes Sister    Uterine cancer Sister        dx late 60s   Breast cancer Maternal Aunt        dx 70s-80s   Melanoma Maternal Aunt        d. ~70; mets    ALLERGIES:  has no known allergies.  MEDICATIONS:  Current Outpatient Medications  Medication Sig Dispense Refill   anastrozole  (ARIMIDEX) 1 MG tablet Take 1 tablet (1 mg total) by mouth daily. 90 tablet 3   clobetasol ointment (TEMOVATE) 0.05 % Apply 1 Application topically 2 (two) times daily. 30 g 0   Ibuprofen (ADVIL PO) Take by mouth as needed.      Multiple Vitamin (MULTIVITAMIN) capsule Take 1 capsule by mouth daily.       VITAMIN D PO Take by mouth.     vitamin E 45 MG (100 UNITS) capsule 1 capsule     No current facility-administered medications for this visit.    REVIEW OF SYSTEMS:   Constitutional: Denies fevers, chills or abnormal night sweats Eyes: Denies blurriness of vision, double vision or watery eyes Ears, nose, mouth, throat, and face: Denies mucositis or sore throat Respiratory: Denies cough, dyspnea or wheezes Cardiovascular: Denies palpitation, chest discomfort or lower extremity swelling Gastrointestinal:  Denies nausea, heartburn or change in bowel habits Skin: Denies abnormal skin rashes Lymphatics: Denies new lymphadenopathy or easy bruising Neurological:Denies numbness, tingling or new weaknesses Behavioral/Psych: Mood is stable, no new changes  Breast: Denies any palpable lumps or discharge All other systems were reviewed with the patient and are negative.  PHYSICAL EXAMINATION: ECOG PERFORMANCE STATUS: 0 - Asymptomatic  There were no vitals filed for this visit.   There were no vitals filed for this visit.    GENERAL:alert, no distress and comfortable Rest of the physical exam deferred in lieu of counseling  LABORATORY DATA:  I have reviewed the data as listed Lab Results  Component Value Date   WBC 5.7 04/11/2021   HGB 13.4 04/11/2021   HCT 39.9 04/11/2021   MCV 86.2 04/11/2021   PLT 279 04/11/2021   Lab Results  Component Value Date   NA 139 04/11/2021   K 4.0 04/11/2021   CL 106 04/11/2021   CO2 25 04/11/2021    RADIOGRAPHIC STUDIES: I have personally reviewed the radiological reports and agreed with the findings in the report.  ASSESSMENT AND PLAN:   No problem-specific Assessment & Plan notes found for this encounter.    Total time spent: 30 minutes including history, physical exam, review of records, counseling and coordination of care  All questions were answered. The patient knows to call the clinic with any problems, questions or concerns.    Rachel Moulds, MD 11/02/23

## 2023-11-03 ENCOUNTER — Inpatient Hospital Stay: Payer: BC Managed Care – PPO | Attending: Hematology and Oncology | Admitting: Hematology and Oncology

## 2023-11-03 VITALS — BP 125/76 | HR 105 | Temp 98.1°F | Resp 18 | Wt 165.8 lb

## 2023-11-03 DIAGNOSIS — Z79811 Long term (current) use of aromatase inhibitors: Secondary | ICD-10-CM | POA: Insufficient documentation

## 2023-11-03 DIAGNOSIS — Z803 Family history of malignant neoplasm of breast: Secondary | ICD-10-CM | POA: Diagnosis not present

## 2023-11-03 DIAGNOSIS — C50412 Malignant neoplasm of upper-outer quadrant of left female breast: Secondary | ICD-10-CM | POA: Diagnosis not present

## 2023-11-03 DIAGNOSIS — Z8049 Family history of malignant neoplasm of other genital organs: Secondary | ICD-10-CM | POA: Insufficient documentation

## 2023-11-03 DIAGNOSIS — Z170 Estrogen receptor positive status [ER+]: Secondary | ICD-10-CM | POA: Insufficient documentation

## 2023-11-03 DIAGNOSIS — Z808 Family history of malignant neoplasm of other organs or systems: Secondary | ICD-10-CM | POA: Insufficient documentation

## 2023-11-03 DIAGNOSIS — C50911 Malignant neoplasm of unspecified site of right female breast: Secondary | ICD-10-CM

## 2023-11-03 NOTE — Assessment & Plan Note (Signed)
 This is a very pleasant 63 year old postmenopausal female patient with newly diagnosed right breast invasive ductal carcinoma, grade 2 ER/PR positive HER2 negative staged as T1b N0 MX referred to breast S/P lumpectomy, adj radiation and now on anti estrogen therapy who is here for follow up.  Breast cancer On anastrozole with no major side effects. Recent imaging showed fat necrosis and oil cyst in right breast, likely post-surgical. Left breast normal. Palpable surgical clip not concerning. - Continue anastrozole therapy. - Order diagnostic mammogram for February 2026. - Schedule follow-up with breast surgeon around November. - Instruct her to monitor the breast and report any changes.  Bone density changes Slight decrease in lumbar spine and right femur bone density, within normal range. Significant decrease in bilateral hips, consistent with age-related changes. - Encourage weight-bearing exercises such as walking, yoga, and water aerobics. - Continue vitamin D supplementation.  FU with [place] in 1 yr or sooner as needed.

## 2023-11-05 DIAGNOSIS — J101 Influenza due to other identified influenza virus with other respiratory manifestations: Secondary | ICD-10-CM | POA: Diagnosis not present

## 2023-11-05 DIAGNOSIS — R509 Fever, unspecified: Secondary | ICD-10-CM | POA: Diagnosis not present

## 2023-11-05 DIAGNOSIS — R059 Cough, unspecified: Secondary | ICD-10-CM | POA: Diagnosis not present

## 2023-11-17 DIAGNOSIS — J208 Acute bronchitis due to other specified organisms: Secondary | ICD-10-CM | POA: Diagnosis not present

## 2023-11-17 DIAGNOSIS — Z6827 Body mass index (BMI) 27.0-27.9, adult: Secondary | ICD-10-CM | POA: Diagnosis not present

## 2023-12-03 DIAGNOSIS — M79641 Pain in right hand: Secondary | ICD-10-CM | POA: Diagnosis not present

## 2023-12-03 DIAGNOSIS — M654 Radial styloid tenosynovitis [de Quervain]: Secondary | ICD-10-CM | POA: Diagnosis not present

## 2024-01-04 ENCOUNTER — Ambulatory Visit: Payer: BC Managed Care – PPO | Attending: Surgery

## 2024-01-04 VITALS — Wt 159.4 lb

## 2024-01-04 DIAGNOSIS — Z483 Aftercare following surgery for neoplasm: Secondary | ICD-10-CM | POA: Insufficient documentation

## 2024-01-04 NOTE — Therapy (Signed)
 OUTPATIENT PHYSICAL THERAPY SOZO SCREENING NOTE   Patient Name: Connie Alvarez MRN: [id number] DOB:02/02/1961, 63 y.o., female Today's Date: 01/04/2024  PCP: Jimmey Mould, MD REFERRING PROVIDER: Oza Blumenthal, MD   PT End of Session - 01/04/24 1507     Visit Number 2   # unchanged due to screen only   PT Start Time 1505    PT Stop Time 1511    PT Time Calculation (min) 6 min    Activity Tolerance Patient tolerated treatment well    Behavior During Therapy [facility] for tasks assessed/performed             Past Medical History:  Diagnosis Date   Bursitis    Complication of anesthesia    woke up during hysterectomy   COVID 05/2021   Endometriosis    Lichen sclerosus    Personal history of radiation therapy    Past Surgical History:  Procedure Laterality Date   ABDOMINAL HYSTERECTOMY  08/25/2000   TAH,BSO/ENDOMETRIOSIS   BREAST BIOPSY Right 10/27/2022   US  RT BREAST BX W LOC DEV 1ST LESION IMG BX SPEC US  GUIDE 10/27/2022 [facility]   BREAST BIOPSY  11/26/2022   MM RT RADIOACTIVE SEED LOC MAMMO GUIDE 11/26/2022 [facility]   BREAST LUMPECTOMY Right 11/27/2022   BREAST LUMPECTOMY WITH RADIOACTIVE SEED AND SENTINEL LYMPH NODE BIOPSY Right 11/27/2022   Procedure: RIGHT BREAST LUMPECTOMY WITH RADIOACTIVE SEED AND SENTINEL LYMPH NODE BIOPSY;  Surgeon: Oza Blumenthal, MD;  Location: [facility];  Service: General;  Laterality: Right;   BUNIONECTOMY  1981 AND 1989   '81 RIGHT AND '89 LEFT   cataract surgery     right eye   OOPHORECTOMY     BSO   Patient Active Problem List   Diagnosis Date Noted   Genetic screening 11/13/2022   Invasive ductal carcinoma of right breast, stage 1 (HCC) 11/05/2022   Malignant neoplasm of upper-outer quadrant of right breast in female, estrogen receptor positive (HCC) 11/05/2022   History of total abdominal hysterectomy and bilateral salpingo-oophorectomy 04/11/2021   Endometriosis    Lichen sclerosus     Benign colonic polyp     REFERRING DIAG: right breast cancer at risk for lymphedema  THERAPY DIAG: Aftercare following surgery for neoplasm  PERTINENT HISTORY: Patient was diagnosed on 10/29/2022 with right grade 2 invasive ductal carcinoma breast cancer. It measures 5 mm and is located in the upper outer quadrant. It is ER/PR positive and HER2 negative with a Ki67 of 15%. She had a hysterectomy in 2002   PRECAUTIONS: right UE Lymphedema risk, None  SUBJECTIVE: Pt returns for her 3 month L-Dex screen.   PAIN:  Are you having pain? No  SOZO SCREENING: Patient was assessed today using the SOZO machine to determine the lymphedema index score. This was compared to her baseline score. It was determined that she is within the recommended range when compared to her baseline and no further action is needed at this time. She will continue SOZO screenings. These are done every 3 months for 2 years post operatively followed by every 6 months for 2 years, and then annually.   L-DEX FLOWSHEETS - 01/04/24 1500       L-DEX LYMPHEDEMA SCREENING   Measurement Type Unilateral    L-DEX MEASUREMENT EXTREMITY Upper Extremity    POSITION  Standing    DOMINANT SIDE Right    At Risk Side Right    BASELINE SCORE (UNILATERAL) 2.4    L-DEX SCORE (UNILATERAL) 3.7  VALUE CHANGE (UNILAT) 1.3               [name], PTA 01/04/2024, 3:10 PM

## 2024-02-09 ENCOUNTER — Other Ambulatory Visit: Payer: Self-pay | Admitting: Hematology and Oncology

## 2024-03-24 DIAGNOSIS — M654 Radial styloid tenosynovitis [de Quervain]: Secondary | ICD-10-CM | POA: Diagnosis not present

## 2024-04-04 ENCOUNTER — Ambulatory Visit: Attending: Surgery

## 2024-04-04 VITALS — Wt 166.1 lb

## 2024-04-04 DIAGNOSIS — Z483 Aftercare following surgery for neoplasm: Secondary | ICD-10-CM | POA: Insufficient documentation

## 2024-04-04 NOTE — Therapy (Signed)
 OUTPATIENT PHYSICAL THERAPY SOZO SCREENING NOTE   Patient Name: Connie Alvarez MRN: [id number] DOB:1961-01-18, 63 y.o., female Today's Date: 04/04/2024  PCP: Okey Carlin Redbird, MD REFERRING PROVIDER: Vernetta Berg, MD   PT End of Session - 04/04/24 1515     Visit Number 2   # unchanged due to screen only   PT Start Time 1513    PT Stop Time 1517    PT Time Calculation (min) 4 min    Activity Tolerance Patient tolerated treatment well    Behavior During Therapy [facility] for tasks assessed/performed          Past Medical History:  Diagnosis Date   Bursitis    Complication of anesthesia    woke up during hysterectomy   COVID 05/2021   Endometriosis    Lichen sclerosus    Personal history of radiation therapy    Past Surgical History:  Procedure Laterality Date   ABDOMINAL HYSTERECTOMY  08/25/2000   TAH,BSO/ENDOMETRIOSIS   BREAST BIOPSY Right 10/27/2022   US  RT BREAST BX W LOC DEV 1ST LESION IMG BX SPEC US  GUIDE 10/27/2022 [facility]   BREAST BIOPSY  11/26/2022   MM RT RADIOACTIVE SEED LOC MAMMO GUIDE 11/26/2022 [facility]   BREAST LUMPECTOMY Right 11/27/2022   BREAST LUMPECTOMY WITH RADIOACTIVE SEED AND SENTINEL LYMPH NODE BIOPSY Right 11/27/2022   Procedure: RIGHT BREAST LUMPECTOMY WITH RADIOACTIVE SEED AND SENTINEL LYMPH NODE BIOPSY;  Surgeon: Vernetta Berg, MD;  Location: [facility];  Service: General;  Laterality: Right;   BUNIONECTOMY  1981 AND 1989   '81 RIGHT AND '89 LEFT   cataract surgery     right eye   OOPHORECTOMY     BSO   Patient Active Problem List   Diagnosis Date Noted   Genetic screening 11/13/2022   Invasive ductal carcinoma of right breast, stage 1 (HCC) 11/05/2022   Malignant neoplasm of upper-outer quadrant of right breast in female, estrogen receptor positive (HCC) 11/05/2022   History of total abdominal hysterectomy and bilateral salpingo-oophorectomy 04/11/2021   Endometriosis    Lichen sclerosus     Benign colonic polyp     REFERRING DIAG: right breast cancer at risk for lymphedema  THERAPY DIAG: Aftercare following surgery for neoplasm  PERTINENT HISTORY: Patient was diagnosed on 10/29/2022 with right grade 2 invasive ductal carcinoma breast cancer. It measures 5 mm and is located in the upper outer quadrant. It is ER/PR positive and HER2 negative with a Ki67 of 15%. She had a hysterectomy in 2002   PRECAUTIONS: right UE Lymphedema risk, None  SUBJECTIVE: Pt returns for her 3 month L-Dex screen.   PAIN:  Are you having pain? No  SOZO SCREENING: Patient was assessed today using the SOZO machine to determine the lymphedema index score. This was compared to her baseline score. It was determined that she is within the recommended range when compared to her baseline and no further action is needed at this time. She will continue SOZO screenings. These are done every 3 months for 2 years post operatively followed by every 6 months for 2 years, and then annually.   L-DEX FLOWSHEETS - 04/04/24 1500       L-DEX LYMPHEDEMA SCREENING   Measurement Type Unilateral    L-DEX MEASUREMENT EXTREMITY Upper Extremity    POSITION  Standing    DOMINANT SIDE Right    At Risk Side Right    BASELINE SCORE (UNILATERAL) 2.4    L-DEX SCORE (UNILATERAL) 4.2  VALUE CHANGE (UNILAT) 1.8            Aden Berwyn Caldron, PTA 04/04/2024, 3:16 PM

## 2024-04-20 DIAGNOSIS — M654 Radial styloid tenosynovitis [de Quervain]: Secondary | ICD-10-CM | POA: Diagnosis not present

## 2024-04-27 ENCOUNTER — Encounter: Payer: Self-pay | Admitting: Nurse Practitioner

## 2024-04-27 ENCOUNTER — Ambulatory Visit (INDEPENDENT_AMBULATORY_CARE_PROVIDER_SITE_OTHER): Payer: BC Managed Care – PPO | Admitting: Nurse Practitioner

## 2024-04-27 VITALS — BP 108/62 | HR 87 | Ht 64.5 in | Wt 164.0 lb

## 2024-04-27 DIAGNOSIS — Z780 Asymptomatic menopausal state: Secondary | ICD-10-CM

## 2024-04-27 DIAGNOSIS — Z01419 Encounter for gynecological examination (general) (routine) without abnormal findings: Secondary | ICD-10-CM

## 2024-04-27 DIAGNOSIS — L900 Lichen sclerosus et atrophicus: Secondary | ICD-10-CM

## 2024-04-27 DIAGNOSIS — C50411 Malignant neoplasm of upper-outer quadrant of right female breast: Secondary | ICD-10-CM

## 2024-04-27 DIAGNOSIS — Z170 Estrogen receptor positive status [ER+]: Secondary | ICD-10-CM | POA: Diagnosis not present

## 2024-04-27 DIAGNOSIS — Z9289 Personal history of other medical treatment: Secondary | ICD-10-CM

## 2024-04-27 MED ORDER — CLOBETASOL PROPIONATE 0.05 % EX OINT: 1.0000 | TOPICAL_OINTMENT | CUTANEOUS | 2 refills | Status: AC

## 2024-04-27 NOTE — Progress Notes (Signed)
 Connie Alvarez Dec 01, 1960 [id number]   History:  63 y.o. G1P2002 presents for annual exam without GYN complaints. Postmenopausal - no HRT, no bleeding. S/P 2002 TAH BSO for endometriosis. Normal pap history. Lichen sclerosis stable on Clobetasol .  Using twice weekly. March 2024 right breast cancer ER/PR+ HER2-, lumpectomy, radiation, and antiestrogen therapy for management. Negative genetic testing.  Gynecologic History No LMP recorded. Patient has had a hysterectomy.   Contraception/Family planning: status post hysterectomy Sexually active: Yes  Health Maintenance Last Pap: 06/27/2011. Results were: Normal Last mammogram: 10/08/2023. Results were: Right fat necrosis Last colonoscopy: 2022. Results were: Normal, 10-year recall Last Dexa: 11/18/2023. Results were: Normal  Past medical history, past surgical history, family history and social history were all reviewed and documented in the EPIC chart. Married. Retired. Travels often. 8 siblings. One daughter in [place] . One daughter in [place], has 2 and 5 yo children.   ROS:  A ROS was performed and pertinent positives and negatives are included.  Exam:  Vitals:   04/27/24 1006  BP: 108/62  Pulse: 87  SpO2: 97%  Weight: 164 lb (74.4 kg)  Height: 5' 4.5 (1.638 m)      Body mass index is 27.72 kg/m.  General appearance:  Normal Thyroid:  Symmetrical, normal in size, without palpable masses or nodularity. Respiratory  Auscultation:  Clear without wheezing or rhonchi Cardiovascular  Auscultation:  Regular rate, without rubs, murmurs or gallops  Edema/varicosities:  Not grossly evident Abdominal  Soft,nontender, without masses, guarding or rebound.  Liver/spleen:  No organomegaly noted  Hernia:  None appreciated  Skin  Inspection:  Grossly normal   Breasts: Examined lying and sitting.   Right: Without masses, retractions, discharge or axillary adenopathy.   Left: Without masses, retractions, discharge or axillary  adenopathy. Pelvic: External genitalia:  pearly, thin appearance in figure-8 pattern consistent with LS, mild anterior fusion of labia              Urethra:  normal appearing urethra with no masses, tenderness or lesions              Bartholins and Skenes: normal                 Vagina: normal appearing vagina with normal color and discharge, no lesions              Cervix: absent Bimanual Exam:  Uterus:  absent              Adnexa: no mass, fullness, tenderness              Rectovaginal: Deferred              Anus:  normal, no lesions  Connie Alvarez, CMA present as chaperone.   Assessment/Plan:  63 y.o. [id number] for breast and pelvic exam.  Encounter for breast and pelvic examination - Education provided on SBEs, importance of preventative screenings, current guidelines, high calcium diet, regular exercise, and multivitamin daily. Labs with PCP.   Postmenopausal - no HRT  Lichen sclerosus - Plan: clobetasol  cream (TEMOVATE ) 0.05 % twice weekly. Good management.   Malignant neoplasm of upper-outer quadrant of right breast in female, estrogen receptor positive Connie Alvarez) - March 2024 right breast cancer ER/PR+ HER2-, lumpectomy, radiation, and antiestrogen therapy for management (5 year). Negative genetic testing.  Screening for cervical cancer - Normal Pap history.  No longer screening per guidelines.   Screening for colon cancer - 2022 colonoscopy. Will repeat at GI's recommended interval.   Screening  for osteoporosis- 10/2023 normal bone density. y. Continue Calcium + Vitamin D  supplement and regular exercise.   Return in about 1 year (around 04/27/2025) for B&P (high risk).     Connie Alvarez [facility], 10:49 AM 04/27/2024

## 2024-05-18 DIAGNOSIS — Z131 Encounter for screening for diabetes mellitus: Secondary | ICD-10-CM | POA: Diagnosis not present

## 2024-05-18 DIAGNOSIS — Z136 Encounter for screening for cardiovascular disorders: Secondary | ICD-10-CM | POA: Diagnosis not present

## 2024-05-18 DIAGNOSIS — Z13220 Encounter for screening for lipoid disorders: Secondary | ICD-10-CM | POA: Diagnosis not present

## 2024-05-23 DIAGNOSIS — Z0000 Encounter for general adult medical examination without abnormal findings: Secondary | ICD-10-CM | POA: Diagnosis not present

## 2024-05-23 DIAGNOSIS — Z23 Encounter for immunization: Secondary | ICD-10-CM | POA: Diagnosis not present

## 2024-05-24 ENCOUNTER — Other Ambulatory Visit (HOSPITAL_BASED_OUTPATIENT_CLINIC_OR_DEPARTMENT_OTHER): Payer: Self-pay | Admitting: Family Medicine

## 2024-05-24 DIAGNOSIS — Z0001 Encounter for general adult medical examination with abnormal findings: Secondary | ICD-10-CM

## 2024-05-30 ENCOUNTER — Encounter: Payer: Self-pay | Admitting: Obstetrics and Gynecology

## 2024-05-30 ENCOUNTER — Ambulatory Visit (INDEPENDENT_AMBULATORY_CARE_PROVIDER_SITE_OTHER): Admitting: Obstetrics and Gynecology

## 2024-05-30 ENCOUNTER — Encounter: Payer: Self-pay | Admitting: Nurse Practitioner

## 2024-05-30 VITALS — BP 120/78 | HR 73 | Temp 98.3°F | Wt 165.0 lb

## 2024-05-30 DIAGNOSIS — R300 Dysuria: Secondary | ICD-10-CM | POA: Diagnosis not present

## 2024-05-30 DIAGNOSIS — N898 Other specified noninflammatory disorders of vagina: Secondary | ICD-10-CM

## 2024-05-30 DIAGNOSIS — R309 Painful micturition, unspecified: Secondary | ICD-10-CM

## 2024-05-30 LAB — WET PREP FOR TRICH, YEAST, CLUE

## 2024-05-30 MED ORDER — FLUCONAZOLE 150 MG PO TABS: 150.0000 mg | ORAL_TABLET | Freq: Once | ORAL | 0 refills | Status: AC

## 2024-05-30 NOTE — Progress Notes (Signed)
 63 y.o. [id number] female S/P 2002 TAH BSO for endometriosis. Lichen sclerosis stable on Clobetasol . March 2024 right breast cancer ER/PR+ HER2-, lumpectomy, radiation, and antiestrogen therapy for management. Negative genetic testing.  here for problem visit. Married.  No LMP recorded. Patient has had a hysterectomy.  She reports pain with urination, vaginal discharge, vaginal itching. Symptoms have been present for 10 days. She has tried monistat 3 day twice, last used last night. Urine sample provided: Yes  Birth control: None Sexually active: No    GYN HISTORY: As noted  OB History  Gravida Para Term Preterm AB Living  2 2 2   2   SAB IAB Ectopic Multiple Live Births      2    # Outcome Date GA Lbr Len/2nd Weight Sex Type Anes PTL Lv  2 Term      Vag-Spont   LIV  1 Term      Vag-Spont   LIV   Past Medical History:  Diagnosis Date   Bursitis    Complication of anesthesia    woke up during hysterectomy   COVID 05/2021   Endometriosis    Lichen sclerosus    Personal history of radiation therapy    Past Surgical History:  Procedure Laterality Date   ABDOMINAL HYSTERECTOMY  08/25/2000   TAH,BSO/ENDOMETRIOSIS   BREAST BIOPSY Right 10/27/2022   US  RT BREAST BX W LOC DEV 1ST LESION IMG BX SPEC US  GUIDE 10/27/2022 [facility]   BREAST BIOPSY  11/26/2022   MM RT RADIOACTIVE SEED LOC MAMMO GUIDE 11/26/2022 [facility]   BREAST LUMPECTOMY Right 11/27/2022   BREAST LUMPECTOMY WITH RADIOACTIVE SEED AND SENTINEL LYMPH NODE BIOPSY Right 11/27/2022   Procedure: RIGHT BREAST LUMPECTOMY WITH RADIOACTIVE SEED AND SENTINEL LYMPH NODE BIOPSY;  Surgeon: Vernetta Berg, MD;  Location: [facility];  Service: General;  Laterality: Right;   BUNIONECTOMY  1981 AND 1989   '81 RIGHT AND '89 LEFT   cataract surgery     right eye   OOPHORECTOMY     BSO   Current Outpatient Medications on File Prior to Visit  Medication Sig Dispense Refill   anastrozole   (ARIMIDEX ) 1 MG tablet TAKE ONE TABLET BY MOUTH DAILY 90 tablet 3   clobetasol  ointment (TEMOVATE ) 0.05 % Apply 1 Application topically 2 (two) times a week. 30 g 2   Ibuprofen (ADVIL PO) Take by mouth as needed.      meloxicam (MOBIC) 15 MG tablet Take 15 mg by mouth daily.     Multiple Vitamin (MULTIVITAMIN) capsule Take 1 capsule by mouth daily.       VITAMIN D  PO Take by mouth.     vitamin E 45 MG (100 UNITS) capsule 1 capsule     No current facility-administered medications on file prior to visit.   No Known Allergies    PE Today's Vitals   05/30/24 1138  BP: 120/78  Pulse: 73  Temp: 98.3 F (36.8 C)  TempSrc: Oral  SpO2: 98%  Weight: 165 lb (74.8 kg)   Body mass index is 27.88 kg/m.  Physical Exam Vitals reviewed.  Constitutional:      General: She is not in acute distress.    Appearance: Normal appearance.  HENT:     Head: Normocephalic and atraumatic.     Nose: Nose normal.  Eyes:     Extraocular Movements: Extraocular movements intact.     Conjunctiva/sclera: Conjunctivae normal.  Pulmonary:     Effort: Pulmonary effort is normal.  Genitourinary:    Uterus: Absent.      Comments: Vulvar atrophy Uterus and cervix absent Musculoskeletal:        General: Normal range of motion.     Cervical back: Normal range of motion.  Neurological:     General: No focal deficit present.     Mental Status: She is alert.  Psychiatric:        Mood and Affect: Mood normal.        Behavior: Behavior normal.      Assessment and Plan:        Pain with urination -     Urinalysis,Complete w/RFL Culture  Vaginal discharge -     WET PREP FOR TRICH, YEAST, CLUE -     Fluconazole ; Take 1 tablet (150 mg total) by mouth once for 1 dose.  Dispense: 1 tablet; Refill: 0  Will treat for presumptive yeast infection Instructed to increase clobetasol  to daily for 2 weeks   Vera LULLA Pa, MD

## 2024-05-30 NOTE — Addendum Note (Signed)
 Addended by: DALLIE BOLLARD V on: 05/30/2024 12:31 PM   Modules accepted: Orders

## 2024-05-30 NOTE — Telephone Encounter (Signed)
 Patient scheduled for office visit today with Dr. Dallie.

## 2024-05-31 LAB — URINE CULTURE
MICRO NUMBER:: 17060754
SPECIMEN QUALITY:: ADEQUATE

## 2024-06-01 ENCOUNTER — Ambulatory Visit: Payer: Self-pay | Admitting: Obstetrics and Gynecology

## 2024-06-02 ENCOUNTER — Ambulatory Visit (HOSPITAL_COMMUNITY)
Admission: RE | Admit: 2024-06-02 | Discharge: 2024-06-02 | Disposition: A | Discharge status: Home / Self Care | Payer: Self-pay | Source: Ambulatory Visit | Attending: Family Medicine | Admitting: Family Medicine

## 2024-06-02 DIAGNOSIS — Z0001 Encounter for general adult medical examination with abnormal findings: Secondary | ICD-10-CM | POA: Insufficient documentation

## 2024-07-18 ENCOUNTER — Ambulatory Visit: Attending: Surgery

## 2024-07-18 VITALS — Wt 167.5 lb

## 2024-07-18 DIAGNOSIS — Z483 Aftercare following surgery for neoplasm: Secondary | ICD-10-CM | POA: Insufficient documentation

## 2024-07-18 NOTE — Therapy (Signed)
 OUTPATIENT PHYSICAL THERAPY SOZO SCREENING NOTE   Patient Name: Connie Alvarez MRN: [id number] DOB:21-Sep-1960, 63 y.o., female Today's Date: 07/18/2024  PCP: Okey Carlin Redbird, MD REFERRING PROVIDER: Vernetta Berg, MD   PT End of Session - 07/18/24 1506     Visit Number 2   # unchanged due to change screen only   PT Start Time 1504    PT Stop Time 1508    PT Time Calculation (min) 4 min    Activity Tolerance Patient tolerated treatment well    Behavior During Therapy [facility] for tasks assessed/performed          Past Medical History:  Diagnosis Date   Bursitis    Complication of anesthesia    woke up during hysterectomy   COVID 05/2021   Endometriosis    Lichen sclerosus    Personal history of radiation therapy    Past Surgical History:  Procedure Laterality Date   ABDOMINAL HYSTERECTOMY  08/25/2000   TAH,BSO/ENDOMETRIOSIS   BREAST BIOPSY Right 10/27/2022   US  RT BREAST BX W LOC DEV 1ST LESION IMG BX SPEC US  GUIDE 10/27/2022 [facility]   BREAST BIOPSY  11/26/2022   MM RT RADIOACTIVE SEED LOC MAMMO GUIDE 11/26/2022 [facility]   BREAST LUMPECTOMY Right 11/27/2022   BREAST LUMPECTOMY WITH RADIOACTIVE SEED AND SENTINEL LYMPH NODE BIOPSY Right 11/27/2022   Procedure: RIGHT BREAST LUMPECTOMY WITH RADIOACTIVE SEED AND SENTINEL LYMPH NODE BIOPSY;  Surgeon: Vernetta Berg, MD;  Location: [facility];  Service: General;  Laterality: Right;   BUNIONECTOMY  1981 AND 1989   '81 RIGHT AND '89 LEFT   cataract surgery     right eye   OOPHORECTOMY     BSO   Patient Active Problem List   Diagnosis Date Noted   Genetic screening 11/13/2022   Invasive ductal carcinoma of right breast, stage 1 (HCC) 11/05/2022   Malignant neoplasm of upper-outer quadrant of right breast in female, estrogen receptor positive (HCC) 11/05/2022   History of total abdominal hysterectomy and bilateral salpingo-oophorectomy 04/11/2021   Endometriosis    Lichen sclerosus     Benign colonic polyp     REFERRING DIAG: right breast cancer at risk for lymphedema  THERAPY DIAG: Aftercare following surgery for neoplasm  PERTINENT HISTORY: Patient was diagnosed on 10/29/2022 with right grade 2 invasive ductal carcinoma breast cancer. It measures 5 mm and is located in the upper outer quadrant. It is ER/PR positive and HER2 negative with a Ki67 of 15%. She had a hysterectomy in 2002 Her Rt breast lumpectomy with SLNB was 11/27/2022  PRECAUTIONS: right UE Lymphedema risk, None  SUBJECTIVE: Pt returns for her 3 month L-Dex screen.   PAIN:  Are you having pain? No  SOZO SCREENING: Patient was assessed today using the SOZO machine to determine the lymphedema index score. This was compared to her baseline score. It was determined that she is within the recommended range when compared to her baseline and no further action is needed at this time. She will continue SOZO screenings. These are done every 3 months for 2 years post operatively followed by every 6 months for 2 years, and then annually.   L-DEX FLOWSHEETS - 07/18/24 1500       L-DEX LYMPHEDEMA SCREENING   Measurement Type Unilateral    L-DEX MEASUREMENT EXTREMITY Upper Extremity    POSITION  Standing    DOMINANT SIDE Right    At Risk Side Right    BASELINE SCORE (UNILATERAL) 2.4  L-DEX SCORE (UNILATERAL) 1.4    VALUE CHANGE (UNILAT) -1          P: Cont every 3 month L-Dex screens until 11/2024 then can transition to every 6 months until 11/2026.  Aden Berwyn Caldron, PTA 07/18/2024, 3:09 PM

## 2024-10-11 ENCOUNTER — Encounter

## 2024-10-17 ENCOUNTER — Ambulatory Visit: Attending: Surgery

## 2024-11-03 ENCOUNTER — Inpatient Hospital Stay: Admitting: Hematology and Oncology

## 2025-05-02 ENCOUNTER — Encounter: Admitting: Nurse Practitioner
